# Patient Record
Sex: Male | Born: 1950 | Race: White | Hispanic: No | Marital: Married | Smoking: Never smoker
Health system: Southern US, Community
[De-identification: ages and names within clinical notes are randomized; demographics above are authoritative.]

## PROBLEM LIST (undated history)

## (undated) DIAGNOSIS — L409 Psoriasis, unspecified: Secondary | ICD-10-CM

## (undated) HISTORY — PX: EYE SURGERY: SHX253

## (undated) HISTORY — PX: RETINAL DETACHMENT SURGERY: SHX105

## (undated) HISTORY — DX: Psoriasis, unspecified: L40.9

## (undated) HISTORY — PX: CATARACT EXTRACTION: SUR2

---

## 2004-12-20 ENCOUNTER — Ambulatory Visit: Payer: Self-pay | Admitting: Internal Medicine

## 2004-12-29 ENCOUNTER — Ambulatory Visit: Payer: Self-pay | Admitting: Internal Medicine

## 2007-03-21 ENCOUNTER — Encounter: Admission: RE | Admit: 2007-03-21 | Discharge: 2007-03-21 | Payer: Self-pay | Admitting: Family Medicine

## 2014-12-20 ENCOUNTER — Encounter: Payer: Self-pay | Admitting: Internal Medicine

## 2014-12-22 ENCOUNTER — Encounter: Payer: Self-pay | Admitting: Internal Medicine

## 2015-07-28 ENCOUNTER — Encounter: Payer: Self-pay | Admitting: Internal Medicine

## 2015-08-30 ENCOUNTER — Other Ambulatory Visit (HOSPITAL_COMMUNITY): Payer: Self-pay | Admitting: Orthopedic Surgery

## 2015-08-30 ENCOUNTER — Other Ambulatory Visit: Payer: Self-pay | Admitting: Orthopedic Surgery

## 2015-08-30 ENCOUNTER — Ambulatory Visit (HOSPITAL_COMMUNITY)
Admission: RE | Admit: 2015-08-30 | Discharge: 2015-08-30 | Disposition: A | Discharge status: Home / Self Care | Payer: Commercial Managed Care - HMO | Source: Ambulatory Visit | Attending: Cardiovascular Disease | Admitting: Cardiovascular Disease

## 2015-08-30 DIAGNOSIS — M79621 Pain in right upper arm: Secondary | ICD-10-CM | POA: Insufficient documentation

## 2015-08-30 DIAGNOSIS — R52 Pain, unspecified: Secondary | ICD-10-CM

## 2015-09-01 ENCOUNTER — Ambulatory Visit
Admission: RE | Admit: 2015-09-01 | Discharge: 2015-09-01 | Disposition: A | Discharge status: Home / Self Care | Payer: 59 | Source: Ambulatory Visit | Attending: Orthopedic Surgery | Admitting: Orthopedic Surgery

## 2015-09-01 DIAGNOSIS — R52 Pain, unspecified: Secondary | ICD-10-CM

## 2015-09-06 ENCOUNTER — Other Ambulatory Visit: Payer: Self-pay | Admitting: Nurse Practitioner

## 2015-09-06 DIAGNOSIS — S46211A Strain of muscle, fascia and tendon of other parts of biceps, right arm, initial encounter: Secondary | ICD-10-CM

## 2015-09-06 DIAGNOSIS — S40021A Contusion of right upper arm, initial encounter: Secondary | ICD-10-CM

## 2015-09-11 ENCOUNTER — Other Ambulatory Visit: Payer: Self-pay

## 2016-05-02 DIAGNOSIS — K4090 Unilateral inguinal hernia, without obstruction or gangrene, not specified as recurrent: Secondary | ICD-10-CM | POA: Diagnosis not present

## 2016-05-02 DIAGNOSIS — Z23 Encounter for immunization: Secondary | ICD-10-CM | POA: Diagnosis not present

## 2016-05-02 DIAGNOSIS — Z125 Encounter for screening for malignant neoplasm of prostate: Secondary | ICD-10-CM | POA: Diagnosis not present

## 2016-05-02 DIAGNOSIS — R1310 Dysphagia, unspecified: Secondary | ICD-10-CM | POA: Diagnosis not present

## 2016-05-02 DIAGNOSIS — Z1211 Encounter for screening for malignant neoplasm of colon: Secondary | ICD-10-CM | POA: Diagnosis not present

## 2016-05-02 DIAGNOSIS — Z0000 Encounter for general adult medical examination without abnormal findings: Secondary | ICD-10-CM | POA: Diagnosis not present

## 2016-05-02 DIAGNOSIS — E781 Pure hyperglyceridemia: Secondary | ICD-10-CM | POA: Diagnosis not present

## 2016-05-02 DIAGNOSIS — R000 Tachycardia, unspecified: Secondary | ICD-10-CM | POA: Diagnosis not present

## 2016-07-13 DIAGNOSIS — Z1211 Encounter for screening for malignant neoplasm of colon: Secondary | ICD-10-CM | POA: Diagnosis not present

## 2016-09-21 DIAGNOSIS — Z0100 Encounter for examination of eyes and vision without abnormal findings: Secondary | ICD-10-CM | POA: Diagnosis not present

## 2016-09-21 DIAGNOSIS — H524 Presbyopia: Secondary | ICD-10-CM | POA: Diagnosis not present

## 2016-10-16 DIAGNOSIS — Z961 Presence of intraocular lens: Secondary | ICD-10-CM | POA: Diagnosis not present

## 2016-10-16 DIAGNOSIS — H25012 Cortical age-related cataract, left eye: Secondary | ICD-10-CM | POA: Diagnosis not present

## 2016-10-16 DIAGNOSIS — H2512 Age-related nuclear cataract, left eye: Secondary | ICD-10-CM | POA: Diagnosis not present

## 2016-10-16 DIAGNOSIS — H2511 Age-related nuclear cataract, right eye: Secondary | ICD-10-CM | POA: Diagnosis not present

## 2016-10-16 DIAGNOSIS — H25041 Posterior subcapsular polar age-related cataract, right eye: Secondary | ICD-10-CM | POA: Diagnosis not present

## 2016-10-16 DIAGNOSIS — H25011 Cortical age-related cataract, right eye: Secondary | ICD-10-CM | POA: Diagnosis not present

## 2016-10-16 DIAGNOSIS — H26491 Other secondary cataract, right eye: Secondary | ICD-10-CM | POA: Diagnosis not present

## 2016-11-06 DIAGNOSIS — H2512 Age-related nuclear cataract, left eye: Secondary | ICD-10-CM | POA: Diagnosis not present

## 2016-11-19 IMAGING — MR MR HUMERUS*R* W/O CM
4 of 7 series · 21 of 40 positions shown · non-contrast
Comparison: None.

CLINICAL DATA: Status post fall 1-2 weeks ago with swelling, pain
and bruising developing up about the right upper arm after the fall.
Initial encounter.

EXAM:
MRI OF THE RIGHT HUMERUS WITHOUT CONTRAST
TECHNIQUE: Multiplanar, multisequence MR imaging was performed. No intravenous
contrast was administered.

[Series 5: T2 fat-sat · axial · 6.0mm · 0.35mm/px · z∈[-73,+149]mm · 7 of 30 slices shown (1 of 2)]
[im 1/30]
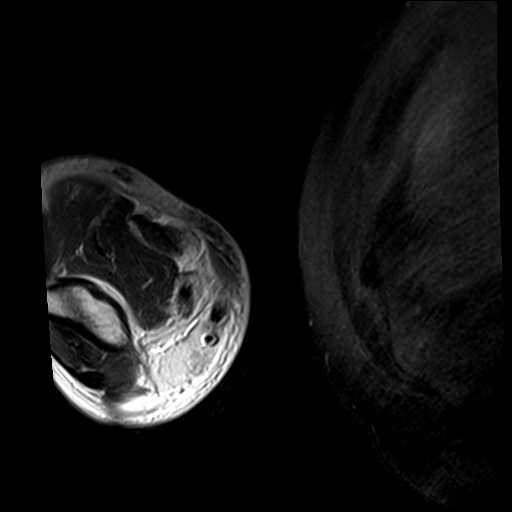
[im 5/30]
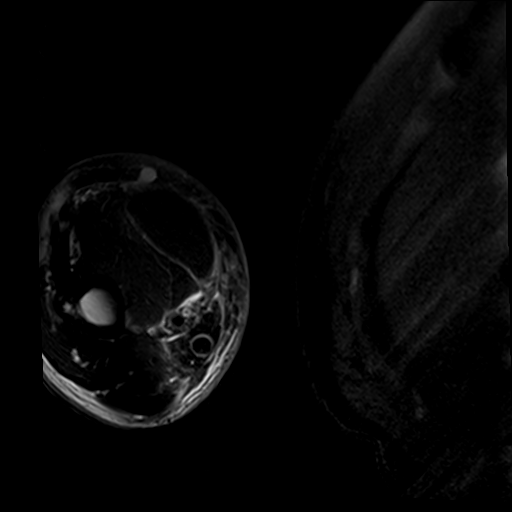
[im 10/30]
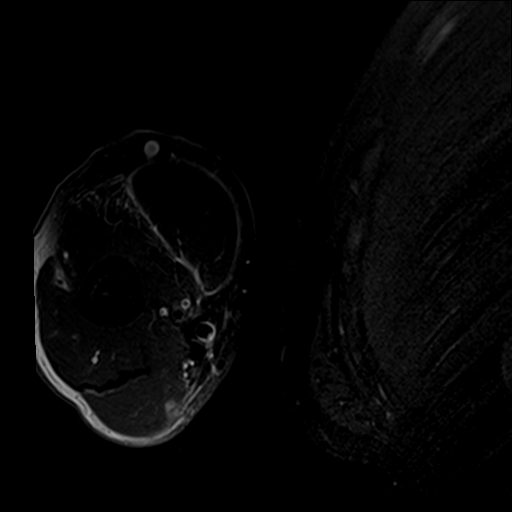
[im 15/30]
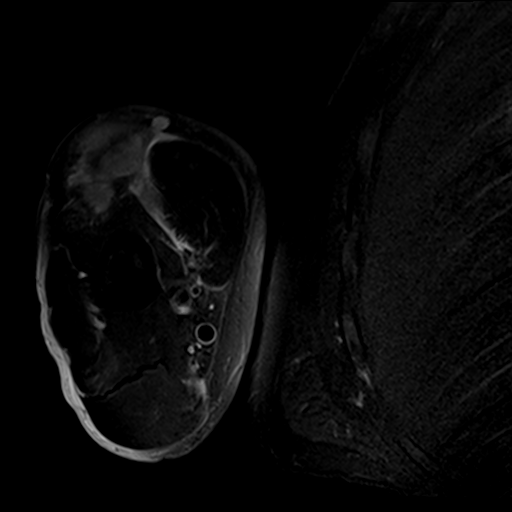
[im 20/30]
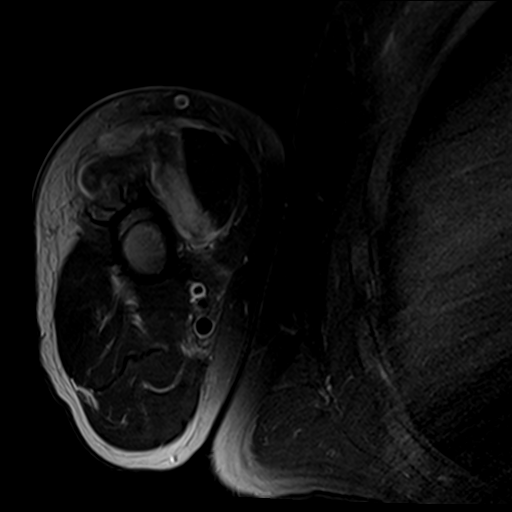
[im 25/30]
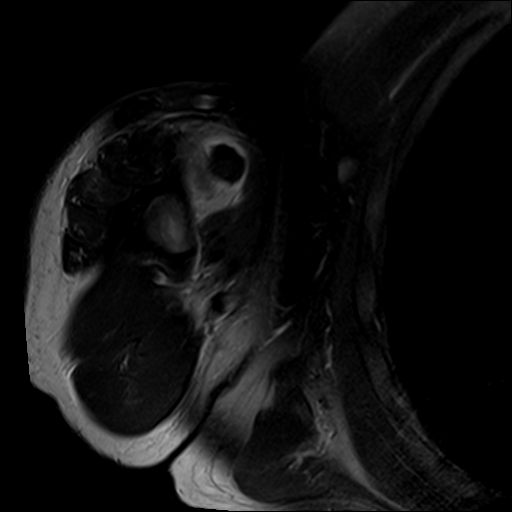
[im 30/30]
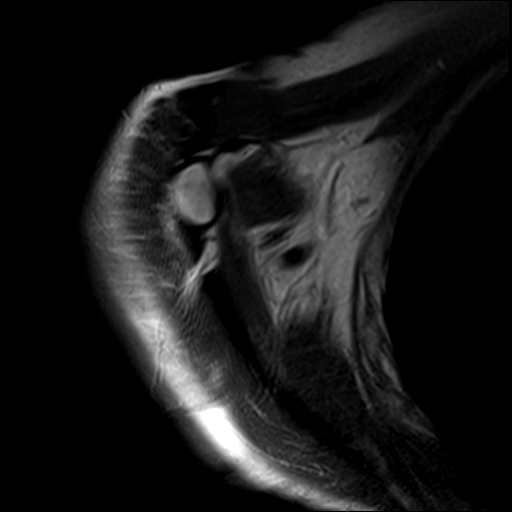

[Series 6: T1 · axial · 6.0mm · 0.35mm/px · z∈[-73,+111]mm · 6 of 30 slices shown (1 of 2)]
[im 1/30]
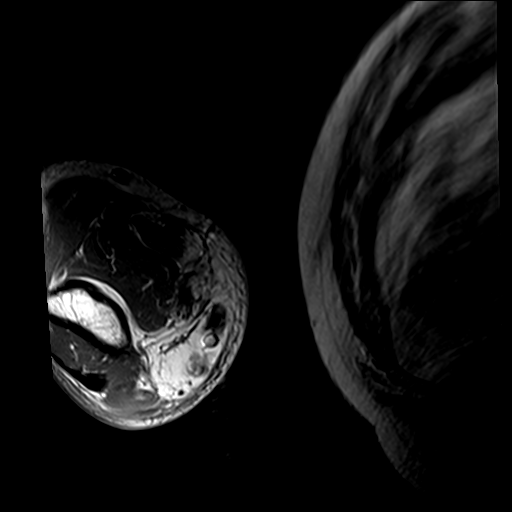
[im 5/30]
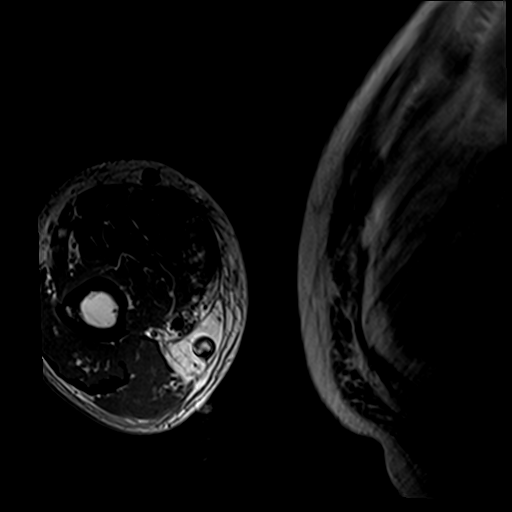
[im 9/30]
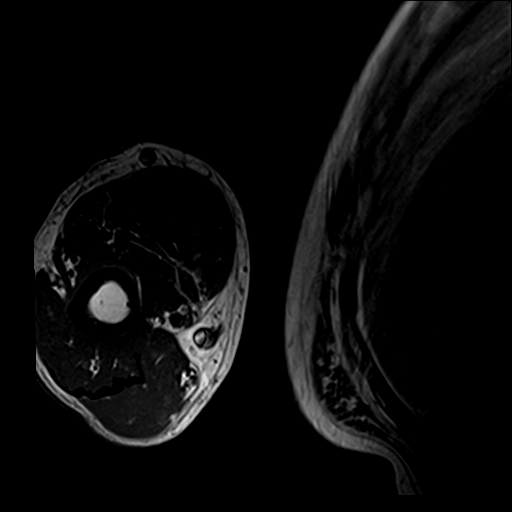
[im 13/30]
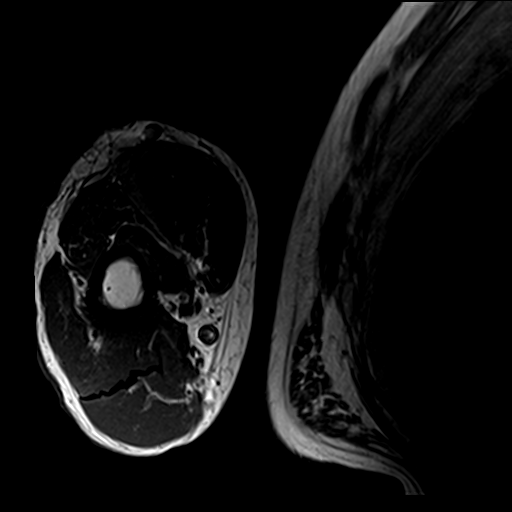
[im 17/30]
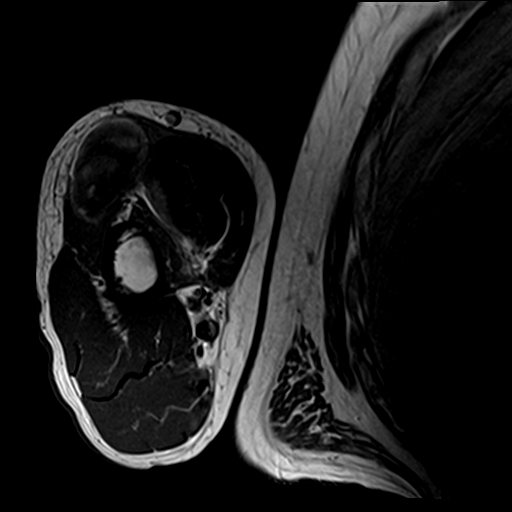
[im 25/30]
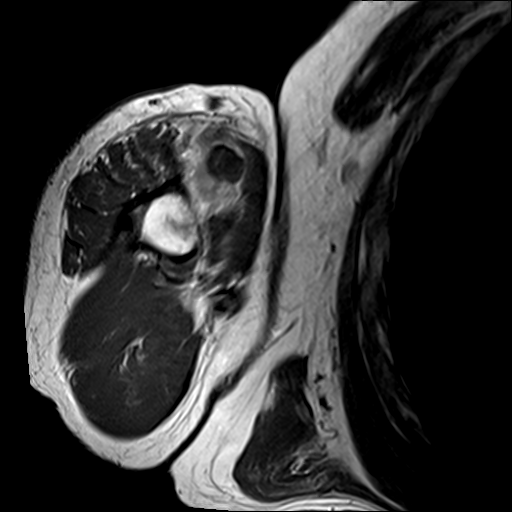

[Series 7: T1 · coronal · 5.5mm · 0.39mm/px · 3 of 18 slices shown (2 of 2)]
[im 1/18]
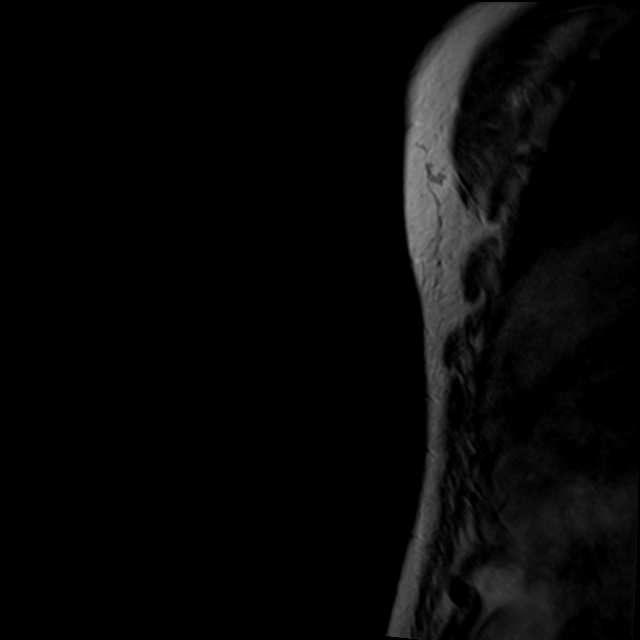
[im 9/18]
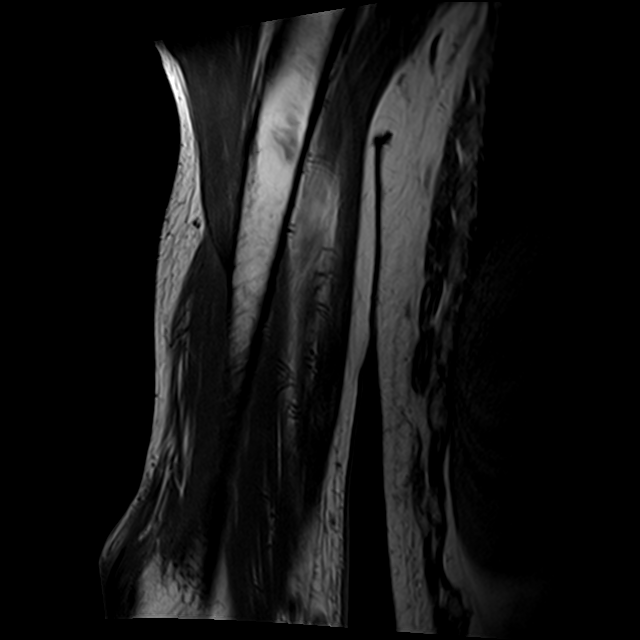
[im 18/18]
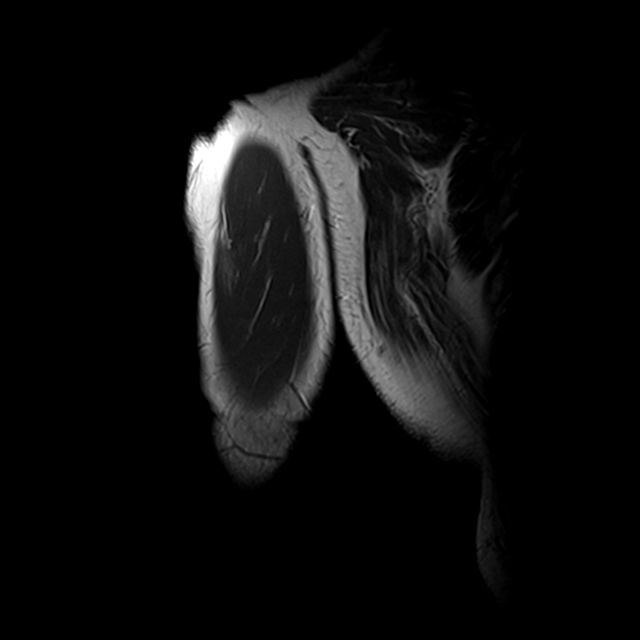

[Series 9: T2 fat-sat · coronal · 5.5mm · 0.86mm/px · 5 of 18 slices shown (2 of 2)]
[im 1/18]
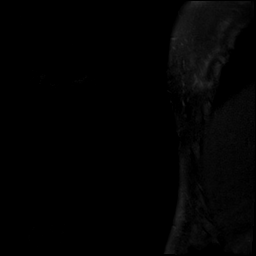
[im 5/18]
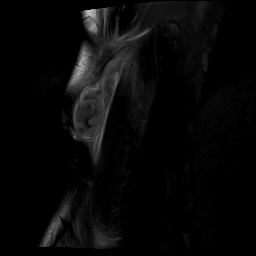
[im 9/18]
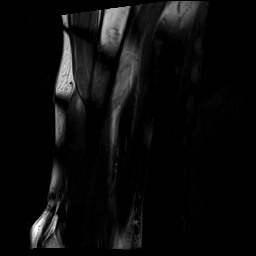
[im 13/18]
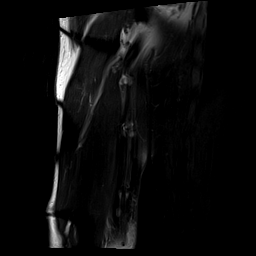
[im 18/18]
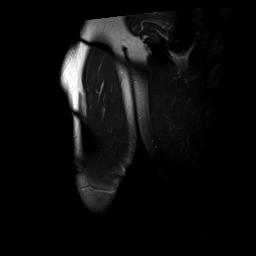

[21 of 40 positions shown; findings below may reference images not displayed]

FINDINGS: Markers are placed above and below the region of concern. Between
the markers, there is a mixed signal intensity collection measuring
approximately 9 cm craniocaudal by up to 3.7 cm AP x 2.3 cm
transverse. The collection is centered in the anterior and lateral
arm lateral to the biceps muscle belly and is most consistent with a
hematoma. No muscle or tendon tear is identified. No fracture is
seen. Bone marrow signal is unremarkable. No other abnormality is
identified.
IMPRESSION: Given history of fall with acute onset of swelling, collection along
the anterior and far lateral aspect of the right upper arm is most
consistent with a hematoma. No muscle tear or fracture is
identified.

## 2016-11-26 DIAGNOSIS — H25012 Cortical age-related cataract, left eye: Secondary | ICD-10-CM | POA: Diagnosis not present

## 2016-11-26 DIAGNOSIS — H25042 Posterior subcapsular polar age-related cataract, left eye: Secondary | ICD-10-CM | POA: Diagnosis not present

## 2016-11-26 DIAGNOSIS — H2512 Age-related nuclear cataract, left eye: Secondary | ICD-10-CM | POA: Diagnosis not present

## 2017-04-25 DIAGNOSIS — L570 Actinic keratosis: Secondary | ICD-10-CM | POA: Diagnosis not present

## 2017-04-25 DIAGNOSIS — L820 Inflamed seborrheic keratosis: Secondary | ICD-10-CM | POA: Diagnosis not present

## 2017-07-08 DIAGNOSIS — M5432 Sciatica, left side: Secondary | ICD-10-CM | POA: Diagnosis not present

## 2017-07-08 DIAGNOSIS — M545 Low back pain: Secondary | ICD-10-CM | POA: Diagnosis not present

## 2017-07-10 DIAGNOSIS — M545 Low back pain: Secondary | ICD-10-CM | POA: Diagnosis not present

## 2017-07-10 DIAGNOSIS — M5432 Sciatica, left side: Secondary | ICD-10-CM | POA: Diagnosis not present

## 2017-08-08 DIAGNOSIS — M25569 Pain in unspecified knee: Secondary | ICD-10-CM | POA: Diagnosis not present

## 2019-01-01 DIAGNOSIS — H5203 Hypermetropia, bilateral: Secondary | ICD-10-CM | POA: Diagnosis not present

## 2019-01-01 DIAGNOSIS — H52223 Regular astigmatism, bilateral: Secondary | ICD-10-CM | POA: Diagnosis not present

## 2019-01-01 DIAGNOSIS — H35022 Exudative retinopathy, left eye: Secondary | ICD-10-CM | POA: Diagnosis not present

## 2019-01-01 DIAGNOSIS — H353221 Exudative age-related macular degeneration, left eye, with active choroidal neovascularization: Secondary | ICD-10-CM | POA: Diagnosis not present

## 2019-01-01 DIAGNOSIS — H3562 Retinal hemorrhage, left eye: Secondary | ICD-10-CM | POA: Diagnosis not present

## 2019-01-01 DIAGNOSIS — H34832 Tributary (branch) retinal vein occlusion, left eye, with macular edema: Secondary | ICD-10-CM | POA: Diagnosis not present

## 2019-01-01 DIAGNOSIS — H524 Presbyopia: Secondary | ICD-10-CM | POA: Diagnosis not present

## 2019-01-01 DIAGNOSIS — H35722 Serous detachment of retinal pigment epithelium, left eye: Secondary | ICD-10-CM | POA: Diagnosis not present

## 2019-01-01 DIAGNOSIS — Z961 Presence of intraocular lens: Secondary | ICD-10-CM | POA: Diagnosis not present

## 2019-01-07 DIAGNOSIS — H35722 Serous detachment of retinal pigment epithelium, left eye: Secondary | ICD-10-CM | POA: Diagnosis not present

## 2019-01-07 DIAGNOSIS — H35022 Exudative retinopathy, left eye: Secondary | ICD-10-CM | POA: Diagnosis not present

## 2019-01-07 DIAGNOSIS — H33052 Total retinal detachment, left eye: Secondary | ICD-10-CM | POA: Diagnosis not present

## 2019-01-08 DIAGNOSIS — H35022 Exudative retinopathy, left eye: Secondary | ICD-10-CM | POA: Diagnosis not present

## 2019-01-08 DIAGNOSIS — H35722 Serous detachment of retinal pigment epithelium, left eye: Secondary | ICD-10-CM | POA: Diagnosis not present

## 2019-01-08 DIAGNOSIS — H353221 Exudative age-related macular degeneration, left eye, with active choroidal neovascularization: Secondary | ICD-10-CM | POA: Diagnosis not present

## 2019-01-08 DIAGNOSIS — H4312 Vitreous hemorrhage, left eye: Secondary | ICD-10-CM | POA: Diagnosis not present

## 2019-01-13 DIAGNOSIS — H4312 Vitreous hemorrhage, left eye: Secondary | ICD-10-CM | POA: Diagnosis not present

## 2019-01-13 DIAGNOSIS — H353221 Exudative age-related macular degeneration, left eye, with active choroidal neovascularization: Secondary | ICD-10-CM | POA: Diagnosis not present

## 2019-01-13 DIAGNOSIS — H35022 Exudative retinopathy, left eye: Secondary | ICD-10-CM | POA: Diagnosis not present

## 2019-01-13 DIAGNOSIS — H35722 Serous detachment of retinal pigment epithelium, left eye: Secondary | ICD-10-CM | POA: Diagnosis not present

## 2019-01-14 DIAGNOSIS — H3562 Retinal hemorrhage, left eye: Secondary | ICD-10-CM | POA: Diagnosis not present

## 2019-01-14 DIAGNOSIS — H4312 Vitreous hemorrhage, left eye: Secondary | ICD-10-CM | POA: Diagnosis not present

## 2019-01-14 DIAGNOSIS — H35022 Exudative retinopathy, left eye: Secondary | ICD-10-CM | POA: Diagnosis not present

## 2019-01-14 DIAGNOSIS — H33052 Total retinal detachment, left eye: Secondary | ICD-10-CM | POA: Diagnosis not present

## 2019-01-14 DIAGNOSIS — H33032 Retinal detachment with giant retinal tear, left eye: Secondary | ICD-10-CM | POA: Diagnosis not present

## 2019-02-05 DIAGNOSIS — T380X5A Adverse effect of glucocorticoids and synthetic analogues, initial encounter: Secondary | ICD-10-CM | POA: Diagnosis not present

## 2019-02-12 DIAGNOSIS — H33052 Total retinal detachment, left eye: Secondary | ICD-10-CM | POA: Diagnosis not present

## 2019-02-12 DIAGNOSIS — Z09 Encounter for follow-up examination after completed treatment for conditions other than malignant neoplasm: Secondary | ICD-10-CM | POA: Diagnosis not present

## 2019-04-09 DIAGNOSIS — T380X5A Adverse effect of glucocorticoids and synthetic analogues, initial encounter: Secondary | ICD-10-CM | POA: Diagnosis not present

## 2019-04-09 DIAGNOSIS — H3342 Traction detachment of retina, left eye: Secondary | ICD-10-CM | POA: Diagnosis not present

## 2019-05-06 DIAGNOSIS — H3342 Traction detachment of retina, left eye: Secondary | ICD-10-CM | POA: Diagnosis not present

## 2019-05-06 DIAGNOSIS — H33022 Retinal detachment with multiple breaks, left eye: Secondary | ICD-10-CM | POA: Diagnosis not present

## 2019-05-06 DIAGNOSIS — Z8669 Personal history of other diseases of the nervous system and sense organs: Secondary | ICD-10-CM | POA: Diagnosis not present

## 2019-05-06 DIAGNOSIS — H33052 Total retinal detachment, left eye: Secondary | ICD-10-CM | POA: Diagnosis not present

## 2019-05-07 DIAGNOSIS — H3342 Traction detachment of retina, left eye: Secondary | ICD-10-CM | POA: Diagnosis not present

## 2019-05-07 DIAGNOSIS — H35022 Exudative retinopathy, left eye: Secondary | ICD-10-CM | POA: Diagnosis not present

## 2019-05-07 DIAGNOSIS — H33052 Total retinal detachment, left eye: Secondary | ICD-10-CM | POA: Diagnosis not present

## 2019-05-07 DIAGNOSIS — H353221 Exudative age-related macular degeneration, left eye, with active choroidal neovascularization: Secondary | ICD-10-CM | POA: Diagnosis not present

## 2019-05-14 DIAGNOSIS — H35352 Cystoid macular degeneration, left eye: Secondary | ICD-10-CM | POA: Diagnosis not present

## 2019-05-14 DIAGNOSIS — Z09 Encounter for follow-up examination after completed treatment for conditions other than malignant neoplasm: Secondary | ICD-10-CM | POA: Diagnosis not present

## 2019-06-16 DIAGNOSIS — Z09 Encounter for follow-up examination after completed treatment for conditions other than malignant neoplasm: Secondary | ICD-10-CM | POA: Diagnosis not present

## 2019-06-16 DIAGNOSIS — H35352 Cystoid macular degeneration, left eye: Secondary | ICD-10-CM | POA: Diagnosis not present

## 2019-08-11 DIAGNOSIS — H33052 Total retinal detachment, left eye: Secondary | ICD-10-CM | POA: Diagnosis not present

## 2019-08-11 DIAGNOSIS — H35352 Cystoid macular degeneration, left eye: Secondary | ICD-10-CM | POA: Diagnosis not present

## 2019-08-11 DIAGNOSIS — H3342 Traction detachment of retina, left eye: Secondary | ICD-10-CM | POA: Diagnosis not present

## 2019-08-25 DIAGNOSIS — M545 Low back pain: Secondary | ICD-10-CM | POA: Diagnosis not present

## 2019-08-25 DIAGNOSIS — M5432 Sciatica, left side: Secondary | ICD-10-CM | POA: Diagnosis not present

## 2019-08-27 DIAGNOSIS — M545 Low back pain: Secondary | ICD-10-CM | POA: Diagnosis not present

## 2019-08-27 DIAGNOSIS — M5432 Sciatica, left side: Secondary | ICD-10-CM | POA: Diagnosis not present

## 2019-09-01 DIAGNOSIS — M5432 Sciatica, left side: Secondary | ICD-10-CM | POA: Diagnosis not present

## 2019-09-01 DIAGNOSIS — M545 Low back pain: Secondary | ICD-10-CM | POA: Diagnosis not present

## 2019-09-03 DIAGNOSIS — M5432 Sciatica, left side: Secondary | ICD-10-CM | POA: Diagnosis not present

## 2019-09-03 DIAGNOSIS — M545 Low back pain: Secondary | ICD-10-CM | POA: Diagnosis not present

## 2019-09-08 DIAGNOSIS — M5432 Sciatica, left side: Secondary | ICD-10-CM | POA: Diagnosis not present

## 2019-09-08 DIAGNOSIS — M545 Low back pain: Secondary | ICD-10-CM | POA: Diagnosis not present

## 2019-09-10 DIAGNOSIS — M545 Low back pain: Secondary | ICD-10-CM | POA: Diagnosis not present

## 2019-09-10 DIAGNOSIS — M5432 Sciatica, left side: Secondary | ICD-10-CM | POA: Diagnosis not present

## 2019-09-14 DIAGNOSIS — M5432 Sciatica, left side: Secondary | ICD-10-CM | POA: Diagnosis not present

## 2019-09-14 DIAGNOSIS — M545 Low back pain: Secondary | ICD-10-CM | POA: Diagnosis not present

## 2019-09-15 DIAGNOSIS — H3342 Traction detachment of retina, left eye: Secondary | ICD-10-CM | POA: Diagnosis not present

## 2019-09-15 DIAGNOSIS — Z09 Encounter for follow-up examination after completed treatment for conditions other than malignant neoplasm: Secondary | ICD-10-CM | POA: Diagnosis not present

## 2019-09-16 DIAGNOSIS — M545 Low back pain: Secondary | ICD-10-CM | POA: Diagnosis not present

## 2019-09-16 DIAGNOSIS — M5432 Sciatica, left side: Secondary | ICD-10-CM | POA: Diagnosis not present

## 2019-09-17 DIAGNOSIS — M47819 Spondylosis without myelopathy or radiculopathy, site unspecified: Secondary | ICD-10-CM | POA: Diagnosis not present

## 2019-09-17 DIAGNOSIS — M5432 Sciatica, left side: Secondary | ICD-10-CM | POA: Diagnosis not present

## 2019-09-17 DIAGNOSIS — M545 Low back pain: Secondary | ICD-10-CM | POA: Diagnosis not present

## 2019-09-17 DIAGNOSIS — M19011 Primary osteoarthritis, right shoulder: Secondary | ICD-10-CM | POA: Diagnosis not present

## 2019-09-17 DIAGNOSIS — M25811 Other specified joint disorders, right shoulder: Secondary | ICD-10-CM | POA: Diagnosis not present

## 2019-09-21 DIAGNOSIS — M5432 Sciatica, left side: Secondary | ICD-10-CM | POA: Diagnosis not present

## 2019-09-21 DIAGNOSIS — M545 Low back pain: Secondary | ICD-10-CM | POA: Diagnosis not present

## 2019-09-23 DIAGNOSIS — M545 Low back pain: Secondary | ICD-10-CM | POA: Diagnosis not present

## 2019-09-23 DIAGNOSIS — M5432 Sciatica, left side: Secondary | ICD-10-CM | POA: Diagnosis not present

## 2019-09-25 DIAGNOSIS — M545 Low back pain: Secondary | ICD-10-CM | POA: Diagnosis not present

## 2019-09-25 DIAGNOSIS — M5432 Sciatica, left side: Secondary | ICD-10-CM | POA: Diagnosis not present

## 2019-10-07 DIAGNOSIS — M545 Low back pain: Secondary | ICD-10-CM | POA: Diagnosis not present

## 2019-10-07 DIAGNOSIS — M5432 Sciatica, left side: Secondary | ICD-10-CM | POA: Diagnosis not present

## 2019-11-22 ENCOUNTER — Other Ambulatory Visit: Payer: Self-pay

## 2019-11-22 ENCOUNTER — Encounter: Payer: Self-pay | Admitting: Emergency Medicine

## 2019-11-22 ENCOUNTER — Ambulatory Visit
Admission: EM | Admit: 2019-11-22 | Discharge: 2019-11-22 | Disposition: A | Discharge status: Home / Self Care | Payer: Medicare HMO | Attending: Physician Assistant | Admitting: Physician Assistant

## 2019-11-22 DIAGNOSIS — L2489 Irritant contact dermatitis due to other agents: Secondary | ICD-10-CM | POA: Diagnosis not present

## 2019-11-22 MED ORDER — DEXAMETHASONE SODIUM PHOSPHATE 10 MG/ML IJ SOLN
10.0000 mg | Freq: Once | INTRAMUSCULAR | Status: AC
  Administered 2019-11-22: 10 mg via INTRAMUSCULAR

## 2019-11-22 NOTE — ED Triage Notes (Signed)
Wonders if it is stress related, wife had two strokes 3 weeks ago

## 2019-11-22 NOTE — ED Triage Notes (Signed)
Rash started 3 weeks ago over lower legs. Rash has since spread up legs and onto arms and back. Describes it as "bubbly" initially. Has since dried out. Areas are itchy.

## 2019-11-22 NOTE — Discharge Instructions (Signed)
As discussed, rash most consistent with irritation from contact. At this time, avoid soap to the skin and apply lotion. Decadron injection in office today. If rash not improving after 3 days, please call back and we may add on steroid pills. Monitor for any infection like symptoms such as redness, warmth, swelling, pain, follow up for reevaluation needed.

## 2019-11-22 NOTE — ED Provider Notes (Signed)
[facility]    CSN: [id number] Arrival date & time: 11/22/19  0802      History   Chief Complaint Chief Complaint  Patient presents with  . Rash    HPI Glenn Blevins is a 68 y.o. male.   68 year old male comes in for 3-week history of rash.  This for started after yard work to bilateral lower legs.  Patient describes it as vesicular in nature with itching and oozing, and thought it to be poison ivy rash.  Unfortunately, around the same time, patient's spouse had a stroke, and he was unable to come in for evaluation.  Since then, rash has spread to the back, upper extremity with itching sensation.  Denies spreading erythema, warmth, fever.  Denies URI symptoms such as cough, congestion, sore throat.  Denies burning sensation, numbness, tingling.     History reviewed. No pertinent past medical history.  There are no problems to display for this patient.   Past Surgical History:  Procedure Laterality Date  . EYE SURGERY         Home Medications    Prior to Admission medications   Not on File    Family History History reviewed. No pertinent family history.  Social History Social History   Tobacco Use  . Smoking status: Never Smoker  . Smokeless tobacco: Never Used  Substance Use Topics  . Alcohol use: Not on file  . Drug use: Not on file     Allergies   Patient has no known allergies.   Review of Systems Review of Systems  Reason unable to perform ROS: See HPI as above.     Physical Exam Triage Vital Signs ED Triage Vitals  Enc Vitals Group     BP 11/22/19 0819 132/82     Pulse Rate 11/22/19 0819 (!) 122     Resp 11/22/19 0819 16     Temp 11/22/19 0819 98.9 F (37.2 C)     Temp Source 11/22/19 0819 Oral     SpO2 11/22/19 0819 97 %     Weight --      Height --      Head Circumference --      Peak Flow --      Pain Score 11/22/19 0817 0     Pain Loc --      Pain Edu? --      Excl. in [place]? --    No data found.  Updated  Vital Signs BP 132/82   Pulse (!) 122   Temp 98.9 F (37.2 C) (Oral)   Resp 16   SpO2 97%   Physical Exam Constitutional:      General: He is not in acute distress.    Appearance: Normal appearance. He is not ill-appearing, toxic-appearing or diaphoretic.  HENT:     Head: Normocephalic and atraumatic.     Mouth/Throat:     Mouth: Mucous membranes are moist.     Pharynx: Oropharynx is clear. Uvula midline.  Cardiovascular:     Rate and Rhythm: Normal rate and regular rhythm.     Pulses:          Radial pulses are 2+ on the right side and 2+ on the left side.     Heart sounds: Normal heart sounds. No murmur. No friction rub. No gallop.   Pulmonary:     Effort: Pulmonary effort is normal. No accessory muscle usage, prolonged expiration, respiratory distress or retractions.     Comments: Lungs clear to auscultation  without adventitious lung sounds. Musculoskeletal:     Cervical back: Normal range of motion and neck supple.  Skin:    Comments: See picture below.  No active oozing noted.  Skin surrounding rash dry and cracking.  No warmth or pain on palpation.  Neurological:     General: No focal deficit present.     Mental Status: He is alert and oriented to person, place, and time.            UC Treatments / Results  Labs (all labs ordered are listed, but only abnormal results are displayed) Labs Reviewed - No data to display  EKG   Radiology No results found.  Procedures Procedures (including critical care time)  Medications Ordered in UC Medications  dexamethasone (DECADRON) injection 10 mg (10 mg Intramuscular Given 11/22/19 0850)    Initial Impression / Assessment and Plan / UC Course  I have reviewed the triage vital signs and the nursing notes.  Pertinent labs & imaging results that were available during my care of the patient were reviewed by me and considered in my medical decision making (see chart for details).    Will treat for contact  dermatitis.  Patient with good response to Decadron injection in the past, will provide Decadron injection in office today.  If symptoms not improving in 2 to 3 days, can call back for oral steroid course.  Given dry skin, to decrease use of soap at this time and keep skin moisturized.  Return precautions given.  Patient was found tachycardic in office.  Denies chest pain, shortness of breath, weakness, palpitation, dizziness, syncope.  States he has history of tachycardia during office visits due to nervousness.  States usually returns to normal at home.  His pulse 2+, regular.  Will have patient monitor at this time.  Return precautions given.  Patient expresses understanding and agrees to plan.  Final Clinical Impressions(s) / UC Diagnoses   Final diagnoses:  Irritant contact dermatitis due to other agents   ED Prescriptions    None     PDMP not reviewed this encounter.   Ok Edwards, PA-C 11/22/19 1053

## 2019-11-25 ENCOUNTER — Telehealth: Payer: Self-pay | Admitting: Physician Assistant

## 2019-11-25 MED ORDER — PREDNISONE 50 MG PO TABS: 50.0000 mg | ORAL_TABLET | Freq: Every day | ORAL | 0 refills | Status: DC

## 2019-11-25 NOTE — Telephone Encounter (Signed)
Patient called back after visit 11/22/2019.  At that time, we did a Decadron injection, and deferred prednisone.  Patient was to call back if symptoms not improving for prednisone course.  States since Decadron, has had improvement of symptoms, however still itching, and rash is still diffuse.  He denies any pain, fever, swelling.  Will call in prednisone 50 mg x 5 days at this time.  Continue to monitor.  Follow-up with PCP in 1 week if symptoms not improving.  Return precautions given.  Patient expresses understanding and agrees to plan.

## 2019-12-07 ENCOUNTER — Telehealth: Payer: Self-pay | Admitting: Emergency Medicine

## 2019-12-07 NOTE — Telephone Encounter (Signed)
Patient called wanting to know if we could send in an additional prescription for more steroids.  This RN and APP on site reviewed patient's chart, and encouraged patient to follow up with PCP or dermatology since it has been 15 days and it has not resolved.

## 2019-12-09 ENCOUNTER — Telehealth: Payer: Self-pay | Admitting: Physician Assistant

## 2019-12-09 MED ORDER — SARNA 0.5-0.5 % EX LOTN: 1.0000 "application " | TOPICAL_LOTION | CUTANEOUS | 0 refills | Status: DC | PRN

## 2019-12-09 NOTE — Telephone Encounter (Signed)
Patient calls back for continued rash since visit 11/22/2019. He was first given decadron injection with some improvement but no resolution. Therefore, prednisone course 50mg  QD x 5 days was started. Patient states with significant improvement, but rash returned after finishing prednisone course. Main complain is itching sensation. Denies fever, chills, spreading erythema, warmth, pain. Has dermatology appointment in 1 week for further evaluation. Will provide patient with SARNA lotion at for now to help with symptomatic management until evaluation by dermatology. Return precautions given. Patient expresses understanding and agrees to plan.

## 2019-12-15 DIAGNOSIS — H3342 Traction detachment of retina, left eye: Secondary | ICD-10-CM | POA: Diagnosis not present

## 2019-12-15 DIAGNOSIS — H35352 Cystoid macular degeneration, left eye: Secondary | ICD-10-CM | POA: Diagnosis not present

## 2019-12-15 DIAGNOSIS — T380X5A Adverse effect of glucocorticoids and synthetic analogues, initial encounter: Secondary | ICD-10-CM | POA: Diagnosis not present

## 2019-12-15 DIAGNOSIS — H33052 Total retinal detachment, left eye: Secondary | ICD-10-CM | POA: Diagnosis not present

## 2019-12-21 DIAGNOSIS — L259 Unspecified contact dermatitis, unspecified cause: Secondary | ICD-10-CM | POA: Diagnosis not present

## 2019-12-21 DIAGNOSIS — L309 Dermatitis, unspecified: Secondary | ICD-10-CM | POA: Diagnosis not present

## 2019-12-21 DIAGNOSIS — D485 Neoplasm of uncertain behavior of skin: Secondary | ICD-10-CM | POA: Diagnosis not present

## 2020-01-07 ENCOUNTER — Ambulatory Visit: Payer: Medicare HMO

## 2020-01-12 ENCOUNTER — Encounter: Payer: Self-pay | Admitting: Internal Medicine

## 2020-01-12 ENCOUNTER — Telehealth: Payer: Self-pay | Admitting: Internal Medicine

## 2020-01-12 NOTE — Patient Instructions (Signed)
Thank you for choosing Primary Care at [facility] to be your medical home!    Tristan Schroeder was seen by Melina Schools, DO today.   Bethann Berkshire primary care provider is Phill Myron, DO.   For the best care possible, you should try to see Phill Myron, DO whenever you come to the clinic.   We look forward to seeing you again soon!  If you have any questions about your visit today, please call us at [phone] or feel free to reach your primary care provider via [place].

## 2020-01-12 NOTE — Telephone Encounter (Signed)
Called patient to do their pre-visit COVID screening.     Have you tested positive for COVID or are you currently waiting for COVID test results?  Patient said no    Have you recently traveled internationally([place], [place], [place], [place], [place]) or within the [place] to a hotspot area([place], [place], [place], [place], [place])?  Patient said no     Are you currently experiencing any of the following symptoms: fever, cough, SHOB, fatigue, body aches, loss of smell/taste, rash, diarrhea, vomiting, severe headaches, weakness, sore throat?  Patient said no     Have you been in contact with anyone who has recently travelled?  Patient said no    Have you been in contact with anyone who is experiencing any of the above symptoms or been diagnosed with [place]  or works in or has recently visited a SNF?  Patient said no

## 2020-01-13 ENCOUNTER — Other Ambulatory Visit: Payer: Self-pay

## 2020-01-13 ENCOUNTER — Encounter: Payer: Self-pay | Admitting: Internal Medicine

## 2020-01-13 ENCOUNTER — Ambulatory Visit (INDEPENDENT_AMBULATORY_CARE_PROVIDER_SITE_OTHER): Payer: Medicare HMO | Admitting: Internal Medicine

## 2020-01-13 VITALS — BP 164/99 | HR 111 | Temp 97.3°F | Resp 17 | Ht 63.0 in | Wt 156.6 lb

## 2020-01-13 DIAGNOSIS — K4090 Unilateral inguinal hernia, without obstruction or gangrene, not specified as recurrent: Secondary | ICD-10-CM

## 2020-01-13 DIAGNOSIS — Z13228 Encounter for screening for other metabolic disorders: Secondary | ICD-10-CM

## 2020-01-13 DIAGNOSIS — Z1211 Encounter for screening for malignant neoplasm of colon: Secondary | ICD-10-CM | POA: Diagnosis not present

## 2020-01-13 DIAGNOSIS — Z7689 Persons encountering health services in other specified circumstances: Secondary | ICD-10-CM | POA: Diagnosis not present

## 2020-01-13 DIAGNOSIS — R030 Elevated blood-pressure reading, without diagnosis of hypertension: Secondary | ICD-10-CM | POA: Diagnosis not present

## 2020-01-13 DIAGNOSIS — Z1159 Encounter for screening for other viral diseases: Secondary | ICD-10-CM | POA: Diagnosis not present

## 2020-01-13 NOTE — Progress Notes (Addendum)
  Subjective:    Glenn Blevins - 69 y.o. male MRN [id number]  Date of birth: 1951-10-06  HPI  Glenn Blevins is to establish care. 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 No other PMH. Takes no medications.   Elevated BP: Has a BP machine at home because his wife had two strokes in Nov. He monitors her and checks himself. His numbers typically range in 120-130/70-80 range. Says it has never been this high. Drank coffee this morning.   Left Inguinal Hernia: Present at least 5 years. Increasing in size. Not painful. Ready to have surgery, would like to have it done this summer. Requests to have it done this summer.   Depression screen PHQ 2/9 01/13/2020  Decreased Interest 0  Down, Depressed, Hopeless 0  PHQ - 2 Score 0    ROS per HPI    Health Maintenance Due  Topic Date Due  . Hepatitis C Screening  12/07/1951  . COLONOSCOPY  12/29/2014     Past Medical History: There are no problems to display for this patient.     Social History   reports that he has never smoked. He has never used smokeless tobacco. He reports current alcohol use.   Family History  Family history is unknown by patient.   Medications: reviewed and updated   Objective:   Physical Exam BP (!) 164/99   Pulse (!) 111   Temp (!) 97.3 F (36.3 C) (Temporal)   Resp 17   Ht 5\' 3"  (1.6 m)   Wt 156 lb 9.6 oz (71 kg)   SpO2 95%   BMI 27.74 kg/m  Physical Exam  Constitutional: He is oriented to person, place, and time and well-developed, well-nourished, and in no distress. No distress.  HENT:  Head: Normocephalic and atraumatic.  Eyes: Conjunctivae and EOM are normal.  Cardiovascular: Normal rate, regular rhythm and normal heart sounds.  No murmur heard. Pulmonary/Chest: Effort normal and breath sounds normal. No respiratory distress.  Musculoskeletal:        General: Normal range of motion.  Neurological: He is alert and oriented to person, place, and  time.  Skin: Skin is warm and dry. He is not diaphoretic.  Psychiatric: Affect and judgment normal.        Assessment & Plan:   1. Encounter to establish care   2. Need for hepatitis C screening test - Hepatitis C antibody  3. Screening for colon cancer Patient reports had a colonoscopy done within past 10 years. Done 07/13/16 with [facility]. Will obtain records.   4. Elevated BP without diagnosis of hypertension Home BP readings well within range. Continue to monitor at home and at office.   5. Screening for metabolic disorder - Lipid panel - Comprehensive metabolic panel  6. Left inguinal hernia - Ambulatory referral to [place], D.O. 01/13/2020, 9:56 AM Primary Care at [facility]

## 2020-01-14 ENCOUNTER — Other Ambulatory Visit: Payer: Self-pay | Admitting: Internal Medicine

## 2020-01-14 DIAGNOSIS — Z9189 Other specified personal risk factors, not elsewhere classified: Secondary | ICD-10-CM

## 2020-01-14 DIAGNOSIS — E871 Hypo-osmolality and hyponatremia: Secondary | ICD-10-CM

## 2020-01-14 LAB — COMPREHENSIVE METABOLIC PANEL
ALT: 18 IU/L (ref 0–44)
AST: 23 IU/L (ref 0–40)
Albumin/Globulin Ratio: 1.2 (ref 1.2–2.2)
Albumin: 4 g/dL (ref 3.8–4.8)
Alkaline Phosphatase: 78 IU/L (ref 39–117)
BUN/Creatinine Ratio: 15 (ref 10–24)
BUN: 9 mg/dL (ref 8–27)
Bilirubin Total: 0.5 mg/dL (ref 0.0–1.2)
CO2: 25 mmol/L (ref 20–29)
Calcium: 9.6 mg/dL (ref 8.6–10.2)
Chloride: 90 mmol/L — ABNORMAL LOW (ref 96–106)
Creatinine, Ser: 0.62 mg/dL — ABNORMAL LOW (ref 0.76–1.27)
GFR calc Af Amer: 116 mL/min/{1.73_m2} (ref >59)
GFR calc non Af Amer: 101 mL/min/{1.73_m2} (ref >59)
Globulin, Total: 3.4 g/dL (ref 1.5–4.5)
Glucose: 120 mg/dL — ABNORMAL HIGH (ref 65–99)
Potassium: 4.6 mmol/L (ref 3.5–5.2)
Sodium: 128 mmol/L — ABNORMAL LOW (ref 134–144)
Total Protein: 7.4 g/dL (ref 6.0–8.5)

## 2020-01-14 LAB — LIPID PANEL
Chol/HDL Ratio: 1.6 ratio (ref 0.0–5.0)
Cholesterol, Total: 166 mg/dL (ref 100–199)
HDL: 107 mg/dL (ref >39)
LDL Chol Calc (NIH): 49 mg/dL (ref 0–99)
Triglycerides: 44 mg/dL (ref 0–149)
VLDL Cholesterol Cal: 10 mg/dL (ref 5–40)

## 2020-01-14 LAB — HEPATITIS C ANTIBODY: Hep C Virus Ab: 0.1 s/co ratio (ref 0.0–0.9)

## 2020-01-14 MED ORDER — ATORVASTATIN CALCIUM 40 MG PO TABS: 40.0000 mg | ORAL_TABLET | Freq: Every day | ORAL | 3 refills | Status: DC

## 2020-01-14 MED ORDER — ASPIRIN 81 MG PO TBEC: 81.0000 mg | DELAYED_RELEASE_TABLET | Freq: Every day | ORAL | 12 refills | Status: DC

## 2020-01-14 NOTE — Progress Notes (Signed)
Patient notified of results & recommendations. Expressed understanding.  Made a lab appointment for 01/15/20 @ 9:30 AM.

## 2020-01-15 ENCOUNTER — Other Ambulatory Visit: Payer: Medicare HMO

## 2020-01-15 ENCOUNTER — Other Ambulatory Visit: Payer: Self-pay

## 2020-01-15 DIAGNOSIS — E871 Hypo-osmolality and hyponatremia: Secondary | ICD-10-CM

## 2020-01-15 NOTE — Progress Notes (Signed)
Patient here for repeat labs. 

## 2020-01-16 LAB — BASIC METABOLIC PANEL
BUN/Creatinine Ratio: 8 — ABNORMAL LOW (ref 10–24)
BUN: 5 mg/dL — ABNORMAL LOW (ref 8–27)
CO2: 24 mmol/L (ref 20–29)
Calcium: 9.5 mg/dL (ref 8.6–10.2)
Chloride: 86 mmol/L — ABNORMAL LOW (ref 96–106)
Creatinine, Ser: 0.64 mg/dL — ABNORMAL LOW (ref 0.76–1.27)
GFR calc Af Amer: 115 mL/min/{1.73_m2} (ref >59)
GFR calc non Af Amer: 99 mL/min/{1.73_m2} (ref >59)
Glucose: 104 mg/dL — ABNORMAL HIGH (ref 65–99)
Potassium: 5.1 mmol/L (ref 3.5–5.2)
Sodium: 128 mmol/L — ABNORMAL LOW (ref 134–144)

## 2020-01-16 LAB — SODIUM, URINE, RANDOM: Sodium, Ur: 33 mmol/L

## 2020-01-16 LAB — OSMOLALITY, URINE: Osmolality, Ur: 314 mOsmol/kg

## 2020-01-18 ENCOUNTER — Telehealth: Payer: Self-pay

## 2020-01-18 ENCOUNTER — Ambulatory Visit (INDEPENDENT_AMBULATORY_CARE_PROVIDER_SITE_OTHER): Payer: Medicare HMO

## 2020-01-18 ENCOUNTER — Other Ambulatory Visit: Payer: Self-pay

## 2020-01-18 DIAGNOSIS — E871 Hypo-osmolality and hyponatremia: Secondary | ICD-10-CM

## 2020-01-18 MED ORDER — SODIUM CHLORIDE 0.9 % IV SOLN: Freq: Once | INTRAVENOUS | Status: AC

## 2020-01-18 NOTE — Progress Notes (Signed)
Patient notified of results & recommendations. Expressed understanding.

## 2020-01-18 NOTE — Telephone Encounter (Signed)
Called patient to go over low sodium results & to get him in office for a NS IV.  Left voicemail to call back.

## 2020-01-18 NOTE — Progress Notes (Signed)
Patient here for IV fluids. IV started by RN from Urgent Care at [facility].

## 2020-01-18 NOTE — Progress Notes (Signed)
Noted that patient's Na still low. Given urine Na and urine osmolality results, patient will need to come to clinic for 1L NS infusion to ensure receives >150 mEq of Na over 24 hours. Will then need to return next day for repeat urine studies and BMET. Allyne Gee is aware of this plan and is attempting to call patient to coordinate.   Phill Myron, D.O. Primary Care at [facility]  01/18/2020, 10:01 AM

## 2020-01-18 NOTE — Telephone Encounter (Signed)
Spoke with patient. He is scheduled for a nurse visit on 01/18/2020 @ 10:30 AM. IV will be started by RN from [place] at [facility]

## 2020-01-19 ENCOUNTER — Other Ambulatory Visit: Payer: Medicare HMO

## 2020-01-19 DIAGNOSIS — E871 Hypo-osmolality and hyponatremia: Secondary | ICD-10-CM | POA: Diagnosis not present

## 2020-01-19 NOTE — Progress Notes (Signed)
Patient here for repeat BMP 

## 2020-01-20 LAB — BASIC METABOLIC PANEL
BUN/Creatinine Ratio: 13 (ref 10–24)
BUN: 9 mg/dL (ref 8–27)
CO2: 21 mmol/L (ref 20–29)
Calcium: 9.5 mg/dL (ref 8.6–10.2)
Chloride: 88 mmol/L — ABNORMAL LOW (ref 96–106)
Creatinine, Ser: 0.67 mg/dL — ABNORMAL LOW (ref 0.76–1.27)
GFR calc Af Amer: 113 mL/min/{1.73_m2} (ref >59)
GFR calc non Af Amer: 97 mL/min/{1.73_m2} (ref >59)
Glucose: 117 mg/dL — ABNORMAL HIGH (ref 65–99)
Potassium: 4.4 mmol/L (ref 3.5–5.2)
Sodium: 127 mmol/L — ABNORMAL LOW (ref 134–144)

## 2020-01-21 ENCOUNTER — Other Ambulatory Visit: Payer: Self-pay | Admitting: Internal Medicine

## 2020-01-21 DIAGNOSIS — E871 Hypo-osmolality and hyponatremia: Secondary | ICD-10-CM

## 2020-01-25 ENCOUNTER — Telehealth: Payer: Self-pay | Admitting: Internal Medicine

## 2020-01-25 NOTE — Telephone Encounter (Signed)
Documentation on result note.

## 2020-01-25 NOTE — Telephone Encounter (Signed)
Wife called in regards to patient test results.  Please contact patient at earliest convenience.

## 2020-01-31 ENCOUNTER — Ambulatory Visit: Payer: Medicare HMO | Attending: Internal Medicine

## 2020-01-31 DIAGNOSIS — Z23 Encounter for immunization: Secondary | ICD-10-CM | POA: Insufficient documentation

## 2020-01-31 NOTE — Progress Notes (Signed)
   Covid-19 Vaccination Clinic  Name:  Brittany Pierrelouis    MRN: [id number] DOB: 1950/12/29  01/31/2020  Mr. Decicco was observed post Covid-19 immunization for 15 minutes without incidence. He was provided with Vaccine Information Sheet and instruction to access the V-Safe system.   Mr. Drayton was instructed to call 911 with any severe reactions post vaccine: Marland Kitchen Difficulty breathing  . Swelling of your face and throat  . A fast heartbeat  . A bad rash all over your body  . Dizziness and weakness    Immunizations Administered    Name Date Dose VIS Date Route   Pfizer COVID-19 Vaccine 01/31/2020 11:21 AM 0.3 mL 11/20/2019 Intramuscular   Manufacturer: [place]   Lot: [id number]   [place]: [id number]

## 2020-02-01 DIAGNOSIS — K4020 Bilateral inguinal hernia, without obstruction or gangrene, not specified as recurrent: Secondary | ICD-10-CM | POA: Diagnosis not present

## 2020-02-01 DIAGNOSIS — R69 Illness, unspecified: Secondary | ICD-10-CM | POA: Diagnosis not present

## 2020-02-08 DIAGNOSIS — L309 Dermatitis, unspecified: Secondary | ICD-10-CM | POA: Diagnosis not present

## 2020-02-24 ENCOUNTER — Ambulatory Visit: Payer: Medicare HMO | Attending: Internal Medicine

## 2020-02-24 DIAGNOSIS — Z23 Encounter for immunization: Secondary | ICD-10-CM

## 2020-02-24 NOTE — Progress Notes (Signed)
   Covid-19 Vaccination Clinic  Name:  Glenn Blevins    MRN: [id number] DOB: 11-19-1951  02/24/2020  [name] was observed post Covid-19 immunization for 15 minutes without incident. He was provided with Vaccine Information Sheet and instruction to access the V-Safe system.   Mr. Roll was instructed to call 911 with any severe reactions post vaccine: Marland Kitchen Difficulty breathing  . Swelling of face and throat  . A fast heartbeat  . A bad rash all over body  . Dizziness and weakness   Immunizations Administered    Name Date Dose VIS Date Route   Pfizer COVID-19 Vaccine 02/24/2020  8:40 AM 0.3 mL 11/20/2019 Intramuscular   Manufacturer: [place]   Lot: [id number]   [place]: [id number]

## 2020-03-15 ENCOUNTER — Other Ambulatory Visit: Payer: Self-pay

## 2020-03-15 ENCOUNTER — Ambulatory Visit (INDEPENDENT_AMBULATORY_CARE_PROVIDER_SITE_OTHER): Payer: Medicare HMO | Admitting: Ophthalmology

## 2020-03-15 ENCOUNTER — Encounter (INDEPENDENT_AMBULATORY_CARE_PROVIDER_SITE_OTHER): Payer: Self-pay | Admitting: Ophthalmology

## 2020-03-15 DIAGNOSIS — H353221 Exudative age-related macular degeneration, left eye, with active choroidal neovascularization: Secondary | ICD-10-CM | POA: Diagnosis not present

## 2020-06-10 ENCOUNTER — Ambulatory Visit: Payer: Self-pay | Admitting: Surgery

## 2020-06-10 DIAGNOSIS — K4020 Bilateral inguinal hernia, without obstruction or gangrene, not specified as recurrent: Secondary | ICD-10-CM | POA: Diagnosis not present

## 2020-06-10 DIAGNOSIS — R69 Illness, unspecified: Secondary | ICD-10-CM | POA: Diagnosis not present

## 2020-06-10 NOTE — H&P (Signed)
History of Present Illness Imogene Burn. Liston Thum MD; 06/10/2020 12:28 PM) The patient is a 69 year old male who presents with an inguinal hernia. Referred by Hosie Poisson, DO for left inguinal hernia  This is a 69 year old male in reasonably good health who presents with at least 5 year history of an enlarging left inguinal hernia. This does not cause much pain but this has become very large and extends down into his left scrotum. He denies any symptoms on his right side. He denies any obstructive symptoms. He states that he is able to push the hernia back in when he is supine.  He was originally seen in February of this year. However he had to delay scheduling surgery due to health issues with his wife as well as his mother. He comes in today to discuss planning surgery for later in August.   Problem List/Past Medical Imogene Burn. 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 Georgette Dover, MD; 06/10/2020 12:28 PM) Vasectomy  Diagnostic Studies History Imogene Burn. Katja Blue, MD; 06/10/2020 12:28 PM) Colonoscopy 1-5 years ago  Allergies Rodman Key K. Yafet Cline, MD; 06/10/2020 12:28 PM) No Known Drug Allergies [02/01/2020]: Allergies Reconciled No Known Allergies [02/01/2020]:  Medication History Darden Palmer, RMA; 06/10/2020 11:00 AM) No Current Medications Medications Reconciled  Social History Imogene Burn. Kasaundra Fahrney, MD; 06/10/2020 12:28 PM) Alcohol use Moderate alcohol use. Caffeine use Coffee.    Vitals Lattie Haw Caldwell RMA; 06/10/2020 11:00 AM) 06/10/2020 11:00 AM Weight: 164.5 lb Height: 71in Body Surface Area: 1.94 m Body Mass Index: 22.94 kg/m  Temp.: 98.77F  Pulse: 108 (Regular)  BP: 150/90(Sitting, Left Arm, Standard)        Physical Exam Rodman Key K. Nioka Thorington MD; 06/10/2020 12:29 PM)  The physical exam findings are as follows: Note:Constitutional: WDWN in NAD,  conversant, no obvious deformities; lying in bed comfortably Eyes: Pupils equal, round; sclera anicteric; moist conjunctiva; no lid lag HENT: Oral mucosa moist; good dentition Neck: No masses palpated, trachea midline; no thyromegaly Lungs: CTA bilaterally; normal respiratory effort CV: Regular rate and rhythm; no murmurs; extremities well-perfused with no edema Abd: +bowel sounds, soft, non-tender, no palpable organomegaly; no palpable umbilical hernias GU: Descended testes, no testicular masses, very large left inguinal hernia extending down into the scrotum. This is reducible when he is supine Patient has a small reducible right inguinal hernia On the anterior surface of the shaft of his penis, there is a 1 cm verrucous lesion Musc: Unable to assess gait; no apparent clubbing or cyanosis in extremities Lymphatic: No palpable cervical or axillary lymphadenopathy Skin: Warm, dry; no sign of jaundice Psychiatric - alert and oriented x 4; calm mood and affect    Assessment & Plan Rodman Key K. 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 The surgical procedure has been discussed with the patient. Potential risks, benefits, alternative treatments, and expected outcomes have been explained. All of the patient's questions at this time have been answered. The likelihood of reaching the patient's treatment goal is good. The patient understand the proposed surgical procedure and wishes to proceed.  Imogene Burn. Georgette Dover, MD, [facility]  General/ Trauma Surgery   06/10/2020 12:29 PM

## 2020-07-08 DIAGNOSIS — Z01818 Encounter for other preprocedural examination: Secondary | ICD-10-CM | POA: Diagnosis not present

## 2020-07-12 DIAGNOSIS — K4090 Unilateral inguinal hernia, without obstruction or gangrene, not specified as recurrent: Secondary | ICD-10-CM | POA: Diagnosis not present

## 2020-07-12 DIAGNOSIS — G8918 Other acute postprocedural pain: Secondary | ICD-10-CM | POA: Diagnosis not present

## 2020-07-12 DIAGNOSIS — R69 Illness, unspecified: Secondary | ICD-10-CM | POA: Diagnosis not present

## 2020-07-12 DIAGNOSIS — K4020 Bilateral inguinal hernia, without obstruction or gangrene, not specified as recurrent: Secondary | ICD-10-CM | POA: Diagnosis not present

## 2020-07-12 DIAGNOSIS — A630 Anogenital (venereal) warts: Secondary | ICD-10-CM | POA: Diagnosis not present

## 2020-07-14 ENCOUNTER — Encounter (INDEPENDENT_AMBULATORY_CARE_PROVIDER_SITE_OTHER): Payer: Medicare HMO | Admitting: Ophthalmology

## 2020-08-24 ENCOUNTER — Ambulatory Visit (INDEPENDENT_AMBULATORY_CARE_PROVIDER_SITE_OTHER): Payer: Medicare HMO | Admitting: Ophthalmology

## 2020-08-24 ENCOUNTER — Other Ambulatory Visit: Payer: Self-pay

## 2020-08-24 ENCOUNTER — Encounter (INDEPENDENT_AMBULATORY_CARE_PROVIDER_SITE_OTHER): Payer: Self-pay | Admitting: Ophthalmology

## 2020-08-24 DIAGNOSIS — Z8669 Personal history of other diseases of the nervous system and sense organs: Secondary | ICD-10-CM | POA: Insufficient documentation

## 2020-08-24 DIAGNOSIS — H353222 Exudative age-related macular degeneration, left eye, with inactive choroidal neovascularization: Secondary | ICD-10-CM

## 2020-08-24 NOTE — Patient Instructions (Signed)
Patient asked to promptly notify us should new visual acuity decline or distortion develop Reminded not to sleep on supine on the back

## 2020-08-24 NOTE — Progress Notes (Signed)
08/24/2020     CHIEF COMPLAINT Patient presents for Retina Follow Up   HISTORY OF PRESENT ILLNESS: Glenn Blevins is a 69 y.o. male who presents to the clinic today for:   HPI    Retina Follow Up    Patient presents with  Wet AMD.  In left eye.  This started 5 months ago.  Severity is mild.  Duration of 5 months.  Since onset it is stable.          Comments    5 Month AMD F/U OS  Pt denies noticeable changes to [place] OU since last visit. Pt denies ocular pain, flashes of light, or floaters OU.         Last edited by Rockie Neighbours, [place] on 08/24/2020  1:05 PM. (History)      Referring physician: Nicolette Bang, [place],  [place] [id number]  HISTORICAL INFORMATION:   Selected notes from the MEDICAL RECORD NUMBER       CURRENT MEDICATIONS: No current outpatient medications on file. (Ophthalmic Drugs)   No current facility-administered medications for this visit. (Ophthalmic Drugs)   Current Outpatient Medications (Other)  Medication Sig  . aspirin (EC-81 ASPIRIN) 81 MG EC tablet Take 1 tablet (81 mg total) by mouth daily. Swallow whole. (Patient not taking: Reported on 03/15/2020)  . atorvastatin (LIPITOR) 40 MG tablet Take 1 tablet (40 mg total) by mouth daily. (Patient not taking: Reported on 03/15/2020)   No current facility-administered medications for this visit. (Other)      REVIEW OF SYSTEMS:    ALLERGIES No Known Allergies  PAST MEDICAL HISTORY Past Medical History:  Diagnosis Date  . Psoriasis    Past Surgical History:  Procedure Laterality Date  . CATARACT EXTRACTION Bilateral unknown   Dr. Talbert Forest  . EYE SURGERY    . RETINAL DETACHMENT SURGERY Left     FAMILY HISTORY Family History  Problem Relation Age of Onset  . Amblyopia Neg Hx   . Blindness Neg Hx   . Cancer Neg Hx   . Cataracts Neg Hx   . Diabetes Neg Hx   . Glaucoma Neg Hx   . Hypertension Neg Hx   . Macular degeneration Neg Hx   . Retinal  detachment Neg Hx   . Strabismus Neg Hx   . Stroke Neg Hx   . Thyroid disease Neg Hx   . Retinitis pigmentosa Neg Hx     SOCIAL HISTORY Social History   Tobacco Use  . Smoking status: Never Smoker  . Smokeless tobacco: Never Used  Substance Use Topics  . Alcohol use: Yes  . Drug use: Not on file         OPHTHALMIC EXAM:  Base Eye Exam    Visual Acuity (ETDRS)      Right Left   Dist [place] 20/20 -2 E card @ 4'   Dist ph [place]  20/400ecc       Tonometry (Tonopen, 1:06 PM)      Right Left   Pressure 09 19       Pupils      Dark Light Shape React APD   Right 3 2 Round Brisk None   Left 3 3 Round Minimal +1       Visual Fields (Counting fingers)      Left Right     Full   Restrictions Total superior temporal, superior nasal, inferior nasal deficiencies        Extraocular Movement  Right Left    Full Full       Neuro/Psych    Oriented x3: Yes   Mood/Affect: Normal       Dilation    Left eye: 1.0% Mydriacyl, 2.5% Phenylephrine @ 1:09 PM        Slit Lamp and Fundus Exam    External Exam      Right Left   External Normal Normal       Slit Lamp Exam      Right Left   Lids/Lashes Normal Normal   Conjunctiva/Sclera White and quiet White and quiet   Cornea Clear Clear   Anterior Chamber Deep and quiet Deep and quiet   Iris Round and reactive Round and reactive   Lens Posterior chamber intraocular lens Posterior chamber intraocular lens   Anterior Vitreous Normal , clear oil.Normal       Fundus Exam      Right Left   Posterior Vitreous Normal , clear oil.Normal   Disc  Normal   C/D Ratio  0.5   Macula   attached   Vessels  Normal   Periphery  Retinal detachment, Rhegmatogenous retinal detachment, Tractional retinal detachment inf nasal to nerve, well demarcated,,, good laser ,, no new breaks or tears.          IMAGING AND PROCEDURES  Imaging and Procedures for 08/24/20  OCT, Retina - OU - Both Eyes       Right Eye Quality was good.  Scan locations included subfoveal. Central Foveal Thickness: 284. Progression has been stable.   Left Eye Quality was good. Scan locations included subfoveal. Central Foveal Thickness: 213. Progression has been stable.   Notes OD, normal with no active maculopathy  OS, diffuse retinal atrophy, retina detached in the posterior segment.                 ASSESSMENT/PLAN:  No problem-specific Assessment & Plan notes found for this encounter.      ICD-10-CM   1. Exudative age-related macular degeneration of left eye with inactive choroidal neovascularization (HCC)  H35.3222 OCT, Retina - OU - Both Eyes  2. History of retinal detachment  Z86.69     1.  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  2.  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  Ophthalmic Meds Ordered this visit:  No orders of the defined types were placed in this encounter.      Return in about 6 months (around 02/21/2021) for COLOR FP, DILATE OU.  Patient Instructions  Patient asked to promptly notify us should new visual acuity decline or distortion develop Reminded not to sleep on supine on the back    Explained the diagnoses, plan, and follow up with the patient and they expressed understanding.  Patient expressed understanding of the importance of proper follow up care.   Clent Demark Maurisio Ruddy M.D. Diseases & Surgery of the Retina and Vitreous Retina & Diabetic [place] 08/24/20     Abbreviations: M myopia (nearsighted); A astigmatism; H hyperopia (farsighted); P presbyopia; Mrx spectacle  prescription;  CTL contact lenses; OD right eye; OS left eye; OU both eyes  XT exotropia;  ET esotropia; PEK punctate epithelial keratitis; PEE punctate epithelial erosions; DES dry eye syndrome; MGD meibomian gland dysfunction; ATs artificial tears; PFAT's preservative free artificial tears; [place] nuclear sclerotic cataract; PSC posterior subcapsular cataract; ERM epi-retinal membrane; PVD posterior vitreous detachment; RD retinal detachment; DM diabetes mellitus; DR diabetic retinopathy; NPDR non-proliferative diabetic retinopathy; PDR proliferative diabetic retinopathy; CSME clinically significant macular edema; DME diabetic macular edema; dbh dot blot hemorrhages; CWS cotton wool spot; POAG primary open angle glaucoma; C/D cup-to-disc ratio; HVF humphrey visual field; GVF goldmann visual field; OCT optical coherence tomography; IOP intraocular pressure; BRVO Branch retinal vein occlusion; CRVO central retinal vein occlusion; CRAO central retinal artery occlusion; BRAO branch retinal artery occlusion; RT retinal tear; SB scleral buckle; PPV pars plana vitrectomy; VH Vitreous hemorrhage; PRP panretinal laser photocoagulation; IVK intravitreal kenalog; VMT vitreomacular traction; MH Macular hole;  NVD neovascularization of the disc; NVE neovascularization elsewhere; AREDS age related eye disease study; ARMD age related macular degeneration; POAG primary open angle glaucoma; EBMD epithelial/anterior basement membrane dystrophy; ACIOL anterior chamber intraocular lens; IOL intraocular lens; PCIOL posterior chamber intraocular lens; Phaco/IOL phacoemulsification with intraocular lens placement; [place] photorefractive keratectomy; LASIK laser assisted in situ keratomileusis; HTN hypertension; DM diabetes mellitus; COPD chronic obstructive pulmonary disease

## 2021-02-22 ENCOUNTER — Encounter (INDEPENDENT_AMBULATORY_CARE_PROVIDER_SITE_OTHER): Payer: Medicare HMO | Admitting: Ophthalmology

## 2021-03-02 ENCOUNTER — Other Ambulatory Visit: Payer: Self-pay

## 2021-03-02 ENCOUNTER — Encounter (INDEPENDENT_AMBULATORY_CARE_PROVIDER_SITE_OTHER): Payer: Self-pay | Admitting: Ophthalmology

## 2021-03-02 ENCOUNTER — Ambulatory Visit (INDEPENDENT_AMBULATORY_CARE_PROVIDER_SITE_OTHER): Payer: Medicare HMO | Admitting: Ophthalmology

## 2021-03-02 DIAGNOSIS — H353222 Exudative age-related macular degeneration, left eye, with inactive choroidal neovascularization: Secondary | ICD-10-CM | POA: Diagnosis not present

## 2021-03-02 MED ORDER — TIMOLOL MALEATE 0.5 % OP SOLN: 1.0000 [drp] | Freq: Two times a day (BID) | OPHTHALMIC | 1 refills | Status: DC

## 2021-03-02 MED ORDER — BRIMONIDINE TARTRATE 0.15 % OP SOLN: 1.0000 [drp] | Freq: Three times a day (TID) | OPHTHALMIC | 1 refills | Status: DC

## 2021-03-02 NOTE — Assessment & Plan Note (Signed)
This form of macular degeneration explained to the patient is related to peripheral retinal changes and does not pose a risk to the center of the vision in the left eye therefore no therapy ongoing

## 2021-03-02 NOTE — Progress Notes (Signed)
03/02/2021     CHIEF COMPLAINT Patient presents for Retina Follow Up (6 Mo F/U OU//Pt denies noticeable changes to [place] OU since last visit. Pt denies ocular pain, flashes of light, or floaters OU. //)   HISTORY OF PRESENT ILLNESS: Glenn Blevins is a 70 y.o. male who presents to the clinic today for:   HPI    Retina Follow Up    Patient presents with  Wet AMD.  In left eye.  This started 6 months ago.  Severity is moderate.  Duration of 6 months.  Since onset it is stable. Additional comments: 6 Mo F/U OU  Pt denies noticeable changes to [place] OU since last visit. Pt denies ocular pain, flashes of light, or floaters OU.          Last edited by Rockie Neighbours, [place] on 03/02/2021  2:45 PM. (History)      Referring physician: Nicolette Bang, [place],  [place] [id number]  HISTORICAL INFORMATION:   Selected notes from the [place]: No current outpatient medications on file. (Ophthalmic Drugs)   No current facility-administered medications for this visit. (Ophthalmic Drugs)   Current Outpatient Medications (Other)  Medication Sig  . aspirin (EC-81 ASPIRIN) 81 MG EC tablet Take 1 tablet (81 mg total) by mouth daily. Swallow whole. (Patient not taking: Reported on 03/15/2020)  . atorvastatin (LIPITOR) 40 MG tablet Take 1 tablet (40 mg total) by mouth daily. (Patient not taking: Reported on 03/15/2020)   No current facility-administered medications for this visit. (Other)      REVIEW OF SYSTEMS:    ALLERGIES No Known Allergies  PAST MEDICAL HISTORY Past Medical History:  Diagnosis Date  . Psoriasis    Past Surgical History:  Procedure Laterality Date  . CATARACT EXTRACTION Bilateral unknown   Dr. Talbert Forest  . EYE SURGERY    . RETINAL DETACHMENT SURGERY Left     FAMILY HISTORY Family History  Problem Relation Age of Onset  . Amblyopia Neg Hx   . Blindness Neg Hx   . Cancer Neg Hx   . Cataracts Neg Hx    . Diabetes Neg Hx   . Glaucoma Neg Hx   . Hypertension Neg Hx   . Macular degeneration Neg Hx   . Retinal detachment Neg Hx   . Strabismus Neg Hx   . Stroke Neg Hx   . Thyroid disease Neg Hx   . Retinitis pigmentosa Neg Hx     SOCIAL HISTORY Social History   Tobacco Use  . Smoking status: Never Smoker  . Smokeless tobacco: Never Used  Substance Use Topics  . Alcohol use: Yes         OPHTHALMIC EXAM: Base Eye Exam    Visual Acuity (ETDRS)      Right Left   Dist [place] 20/20 CF at 3'   Dist ph [place]  20/400ecc       Tonometry (Tonopen, 2:42 PM)      Right Left   Pressure 11 31       Tonometry #2 (Tonopen, 2:49 PM)      Right Left   Pressure  33       Pupils      Dark Light Shape React APD   Right 4.5 3.5 Round Brisk None   Left 4.5 4.5 Round Minimal None       Visual Fields (Counting fingers)      Left Right  Full   Restrictions Total superior temporal, superior nasal, inferior nasal deficiencies        Extraocular Movement      Right Left    Full Full       Neuro/Psych    Oriented x3: Yes   Mood/Affect: Normal       Dilation    Right eye: 1.0% Mydriacyl, 2.5% Phenylephrine @ 2:49 PM  Defer OS dilation due to high IOP        Slit Lamp and Fundus Exam    External Exam      Right Left   External Normal Normal       Slit Lamp Exam      Right Left   Lids/Lashes Normal Normal   Conjunctiva/Sclera White and quiet White and quiet   Cornea Clear Clear   Anterior Chamber Deep and quiet Deep and quiet   Iris Round and reactive Round and reactive   Lens Posterior chamber intraocular lens Posterior chamber intraocular lens, 3+ Posterior capsular opacification   Anterior Vitreous Normal , clear oil.Normal       Fundus Exam      Right Left   Posterior Vitreous Normal Silicone oil   Disc Normal    C/D Ratio 0.35    Macula Normal    Vessels Normal    Periphery Normal           IMAGING AND PROCEDURES  Imaging and Procedures for  03/02/21  Color Fundus Photography Optos - OU - Both Eyes       Right Eye Progression has been stable. Disc findings include normal observations. Macula : normal observations. Vessels : normal observations. Periphery : normal observations.   Left Eye Progression has been stable. Periphery : detachment.   Notes Inferonasal stable, well demarcated, good retinopexy with clear silicone oil.                  ASSESSMENT/PLAN:  Exudative age-related macular degeneration of left eye with inactive choroidal neovascularization ([place]) This form of macular degeneration explained to the patient is related to peripheral retinal changes and does not pose a risk to the center of the vision in the left eye therefore no therapy ongoing      ICD-10-CM   1. Exudative age-related macular degeneration of left eye with inactive choroidal neovascularization (HCC)  H35.3222 Color Fundus Photography Optos - OU - Both Eyes    1.  2.  3.  Ophthalmic Meds Ordered this visit:  No orders of the defined types were placed in this encounter.      Return in about 2 weeks (around 03/16/2021) for IOP check MD exam.  There are no Patient Instructions on file for this visit.   Explained the diagnoses, plan, and follow up with the patient and they expressed understanding.  Patient expressed understanding of the importance of proper follow up care.   Clent Demark Rankin M.D. Diseases & Surgery of the Retina and Vitreous Retina & Diabetic [place] 03/02/21     Abbreviations: M myopia (nearsighted); A astigmatism; H hyperopia (farsighted); P presbyopia; Mrx spectacle prescription;  CTL contact lenses; OD right eye; OS left eye; OU both eyes  XT exotropia; ET esotropia; PEK punctate epithelial keratitis; PEE punctate epithelial erosions; DES dry eye syndrome; MGD meibomian gland dysfunction; ATs artificial tears; PFAT's preservative free artificial tears; [place] nuclear sclerotic cataract; PSC posterior  subcapsular cataract; ERM epi-retinal membrane; PVD posterior vitreous detachment; RD retinal detachment; DM diabetes mellitus; DR diabetic retinopathy; NPDR non-proliferative  diabetic retinopathy; PDR proliferative diabetic retinopathy; CSME clinically significant macular edema; DME diabetic macular edema; dbh dot blot hemorrhages; CWS cotton wool spot; POAG primary open angle glaucoma; C/D cup-to-disc ratio; HVF humphrey visual field; GVF goldmann visual field; OCT optical coherence tomography; IOP intraocular pressure; BRVO Branch retinal vein occlusion; CRVO central retinal vein occlusion; CRAO central retinal artery occlusion; BRAO branch retinal artery occlusion; RT retinal tear; SB scleral buckle; PPV pars plana vitrectomy; VH Vitreous hemorrhage; PRP panretinal laser photocoagulation; IVK intravitreal kenalog; VMT vitreomacular traction; MH Macular hole;  NVD neovascularization of the disc; NVE neovascularization elsewhere; AREDS age related eye disease study; ARMD age related macular degeneration; POAG primary open angle glaucoma; EBMD epithelial/anterior basement membrane dystrophy; ACIOL anterior chamber intraocular lens; IOL intraocular lens; PCIOL posterior chamber intraocular lens; Phaco/IOL phacoemulsification with intraocular lens placement; [place] photorefractive keratectomy; LASIK laser assisted in situ keratomileusis; HTN hypertension; DM diabetes mellitus; COPD chronic obstructive pulmonary disease

## 2021-03-16 ENCOUNTER — Ambulatory Visit (INDEPENDENT_AMBULATORY_CARE_PROVIDER_SITE_OTHER): Payer: Medicare HMO | Admitting: Ophthalmology

## 2021-03-16 ENCOUNTER — Other Ambulatory Visit: Payer: Self-pay

## 2021-03-16 ENCOUNTER — Encounter (INDEPENDENT_AMBULATORY_CARE_PROVIDER_SITE_OTHER): Payer: Self-pay | Admitting: Ophthalmology

## 2021-03-16 DIAGNOSIS — H26492 Other secondary cataract, left eye: Secondary | ICD-10-CM | POA: Diagnosis not present

## 2021-03-16 DIAGNOSIS — H4052X2 Glaucoma secondary to other eye disorders, left eye, moderate stage: Secondary | ICD-10-CM

## 2021-03-16 NOTE — Progress Notes (Signed)
03/16/2021     CHIEF COMPLAINT Patient presents for IOP Check (/2 Wk IOP Check, no dilate//Pt denies noticeable changes to [place] OU since last visit. Pt denies ocular pain, flashes of light, or floaters OU. //)   HISTORY OF PRESENT ILLNESS: Glenn Blevins is a 70 y.o. male who presents to the clinic today for:   HPI    IOP Check     Additional comments:  2 Wk IOP Check, no dilate  Pt denies noticeable changes to [place] OU since last visit. Pt denies ocular pain, flashes of light, or floaters OU.          Last edited by Rockie Neighbours, [place] on 03/16/2021  1:27 PM. (History)      Referring physician: Nicolette Bang, [place],   [id number]  HISTORICAL INFORMATION:   Selected notes from the MEDICAL RECORD NUMBER       CURRENT MEDICATIONS: Current Outpatient Medications (Ophthalmic Drugs)  Medication Sig  . brimonidine (ALPHAGAN) 0.15 % ophthalmic solution Place 1 drop into the left eye every 8 (eight) hours.  . timolol (TIMOPTIC) 0.5 % ophthalmic solution Place 1 drop into the left eye 2 (two) times daily.   No current facility-administered medications for this visit. (Ophthalmic Drugs)   Current Outpatient Medications (Other)  Medication Sig  . aspirin (EC-81 ASPIRIN) 81 MG EC tablet Take 1 tablet (81 mg total) by mouth daily. Swallow whole. (Patient not taking: Reported on 03/15/2020)  . atorvastatin (LIPITOR) 40 MG tablet Take 1 tablet (40 mg total) by mouth daily. (Patient not taking: Reported on 03/15/2020)   No current facility-administered medications for this visit. (Other)      REVIEW OF SYSTEMS:    ALLERGIES No Known Allergies  PAST MEDICAL HISTORY Past Medical History:  Diagnosis Date  . Psoriasis    Past Surgical History:  Procedure Laterality Date  . CATARACT EXTRACTION Bilateral unknown   Dr. Talbert Forest  . EYE SURGERY    . RETINAL DETACHMENT SURGERY Left     FAMILY HISTORY Family History  Problem Relation Age of Onset   . Amblyopia Neg Hx   . Blindness Neg Hx   . Cancer Neg Hx   . Cataracts Neg Hx   . Diabetes Neg Hx   . Glaucoma Neg Hx   . Hypertension Neg Hx   . Macular degeneration Neg Hx   . Retinal detachment Neg Hx   . Strabismus Neg Hx   . Stroke Neg Hx   . Thyroid disease Neg Hx   . Retinitis pigmentosa Neg Hx     SOCIAL HISTORY Social History   Tobacco Use  . Smoking status: Never Smoker  . Smokeless tobacco: Never Used  Substance Use Topics  . Alcohol use: Yes         OPHTHALMIC EXAM: Base Eye Exam    Visual Acuity (ETDRS)      Right Left   Dist [place] 20/20 20/400ecc   Dist ph [place]  NI       Tonometry (Tonopen, 1:28 PM)      Right Left   Pressure 09 10       Neuro/Psych    Oriented x3: Yes   Mood/Affect: Normal       Dilation   Defer per GAR        Slit Lamp and Fundus Exam    External Exam      Right Left   External Normal Normal  Slit Lamp Exam      Right Left   Lids/Lashes Normal Normal   Conjunctiva/Sclera White and quiet White and quiet   Cornea Clear Clear   Anterior Chamber Deep and quiet Deep and quiet   Iris Round and reactive Round and reactive   Lens Posterior chamber intraocular lens Posterior chamber intraocular lens, 3+ Posterior capsular opacification   Anterior Vitreous Normal , clear oil.Normal          IMAGING AND PROCEDURES  Imaging and Procedures for 03/16/21           ASSESSMENT/PLAN:  Secondary glaucoma, left, moderate stage Intraocular pressure today now normalized on 2 medications we will continue  Posterior capsular opacification non visually significant of left eye Central dense opacification of the capsule, yet we will leave the capsule undisturbed until unable to medically evaluate the posterior pole.  At that time YAG capsulotomy may be appropriate.      ICD-10-CM   1. Secondary glaucoma, left, moderate stage  H40.52X2   2. Posterior capsular opacification non visually significant of left eye  H26.492      1.  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  Posterior capsule opacity left eye centered yet the PC intact preserves a barrier between the vitreous oral cavity and anterior chamber.  Will observe until medical monitoring of the posterior pole no longer possible and then YAG capsulotomy  3.  Ophthalmic Meds Ordered this visit:  No orders of the defined types were placed in this encounter.      Return in about 6 months (around 09/15/2021) for DILATE OU, COLOR FP.  There are no Patient Instructions on file for this visit.   Explained the diagnoses, plan, and follow up with the patient and they expressed understanding.  Patient expressed understanding of the importance of proper follow up care.   Clent Demark Irby Fails M.D. Diseases & Surgery of the Retina and Vitreous Retina & Diabetic [place] 03/16/21     Abbreviations: M myopia (nearsighted); A astigmatism; H hyperopia (farsighted); P presbyopia; Mrx spectacle prescription;  CTL contact lenses; OD right eye; OS left eye; OU both eyes  XT exotropia; ET esotropia; PEK punctate epithelial keratitis; PEE punctate epithelial erosions; DES dry eye syndrome; MGD meibomian gland dysfunction; ATs artificial tears; PFAT's preservative free artificial tears; [place] nuclear sclerotic cataract; PSC posterior subcapsular cataract; ERM epi-retinal membrane; PVD posterior vitreous detachment; RD retinal detachment; DM diabetes mellitus; DR diabetic retinopathy; NPDR non-proliferative diabetic retinopathy; PDR proliferative diabetic retinopathy; CSME clinically significant macular edema; DME diabetic macular edema; dbh dot blot hemorrhages; CWS cotton wool spot; POAG primary open angle glaucoma; C/D cup-to-disc ratio; HVF humphrey visual  field; GVF goldmann visual field; OCT optical coherence tomography; IOP intraocular pressure; BRVO Branch retinal vein occlusion; CRVO central retinal vein occlusion; CRAO central retinal artery occlusion; BRAO branch retinal artery occlusion; RT retinal tear; SB scleral buckle; PPV pars plana vitrectomy; VH Vitreous hemorrhage; PRP panretinal laser photocoagulation; IVK intravitreal kenalog; VMT vitreomacular traction; MH Macular hole;  NVD neovascularization of the disc; NVE neovascularization elsewhere; AREDS age related eye disease study; ARMD age related macular degeneration; POAG primary open angle glaucoma; EBMD epithelial/anterior basement membrane dystrophy; ACIOL anterior chamber intraocular lens; IOL intraocular lens; PCIOL posterior chamber intraocular lens; Phaco/IOL phacoemulsification with intraocular lens placement; PRK photorefractive  keratectomy; LASIK laser assisted in situ keratomileusis; HTN hypertension; DM diabetes mellitus; COPD chronic obstructive pulmonary disease

## 2021-03-16 NOTE — Assessment & Plan Note (Signed)
Intraocular pressure today now normalized on 2 medications we will continue

## 2021-03-16 NOTE — Assessment & Plan Note (Signed)
Central dense opacification of the capsule, yet we will leave the capsule undisturbed until unable to medically evaluate the posterior pole.  At that time YAG capsulotomy may be appropriate.

## 2021-04-25 ENCOUNTER — Ambulatory Visit: Payer: Medicare HMO | Admitting: Internal Medicine

## 2021-04-26 ENCOUNTER — Other Ambulatory Visit (INDEPENDENT_AMBULATORY_CARE_PROVIDER_SITE_OTHER): Payer: Self-pay | Admitting: Ophthalmology

## 2021-05-02 ENCOUNTER — Other Ambulatory Visit: Payer: Self-pay

## 2021-05-02 ENCOUNTER — Encounter: Payer: Self-pay | Admitting: Internal Medicine

## 2021-05-02 ENCOUNTER — Ambulatory Visit (INDEPENDENT_AMBULATORY_CARE_PROVIDER_SITE_OTHER): Payer: Medicare HMO | Admitting: Internal Medicine

## 2021-05-02 VITALS — BP 170/108 | HR 111 | Temp 97.3°F | Resp 16 | Wt 172.0 lb

## 2021-05-02 DIAGNOSIS — M25562 Pain in left knee: Secondary | ICD-10-CM | POA: Diagnosis not present

## 2021-05-02 DIAGNOSIS — M25561 Pain in right knee: Secondary | ICD-10-CM

## 2021-05-02 DIAGNOSIS — G8929 Other chronic pain: Secondary | ICD-10-CM | POA: Diagnosis not present

## 2021-05-02 DIAGNOSIS — R030 Elevated blood-pressure reading, without diagnosis of hypertension: Secondary | ICD-10-CM

## 2021-05-02 NOTE — Progress Notes (Signed)
  Subjective:    Glenn Blevins - 70 y.o. male MRN [id number]  Date of birth: [birth year]  HPI  Glenn Blevins is here for knee pain. Reports has been chronic, but has noticed it more this past year. Bilaterally. Reports pain and generalized soreness. Has been "slowing him down". No locking, popping, grinding. No legs giving out. Interested in discussing options such as injections or knee replacement surgery.      Health Maintenance:  Health Maintenance Due  Topic Date Due  . PNA vac Low Risk Adult (1 of 2 - PCV13) Never done  . COLONOSCOPY (Pts 45-52yrs Insurance coverage will need to be confirmed)  12/29/2014  . COVID-19 Vaccine (3 - Booster for Pfizer series) 07/26/2020  . TETANUS/TDAP  12/10/2020    -  reports that he has never smoked. He has never used smokeless tobacco. - Review of Systems: Per HPI. - Past Medical History: Patient Active Problem List   Diagnosis Date Noted  . Secondary glaucoma, left, moderate stage 03/16/2021  . Posterior capsular opacification non visually significant of left eye 03/16/2021  . History of retinal detachment 08/24/2020  . Exudative age-related macular degeneration of left eye with inactive choroidal neovascularization ([place]) 08/24/2020  . Hyponatremia 01/14/2020   - Medications: reviewed and updated   Objective:   Physical Exam BP (!) 176/108   Pulse (!) 112   Temp (!) 97.3 F (36.3 C)   Resp 16   Wt 172 lb (78 kg)   SpO2 95%   BMI 30.47 kg/m  Physical Exam Constitutional:      General: He is not in acute distress.    Appearance: He is not diaphoretic.  Cardiovascular:     Rate and Rhythm: Normal rate.  Pulmonary:     Effort: Pulmonary effort is normal. No respiratory distress.  Musculoskeletal:     Comments: Crepitus at knees bilaterally. Anterior drawer test neg. No joint line TTP.   Skin:    General: Skin is warm and dry.  Neurological:     Mental Status: He is alert and oriented to person, place, and time.   Psychiatric:        Mood and Affect: Affect normal.        Judgment: Judgment normal.            Assessment & Plan:   1. Chronic pain of both knees Suspect OA given history of arthritis in other joints, age, and chronicity bilaterally. Discussed OTC pain relief medications and topical gels. Will defer imaging given orthopedics referral placed. Discussed options that orthopedics may offer if OA is indeed diagnosed.  - AMB referral to orthopedics  2. Elevated blood pressure reading without diagnosis of hypertension Patient reports he has a history of whitecoat HTN. States monitors at home and all within normal range. Asymptomatic. Will need to monitor closely.    Phill Myron, D.O. 05/02/2021, 2:02 PM Primary [facility] at [facility]

## 2021-05-02 NOTE — Progress Notes (Signed)
Has questions r/t knee pain

## 2021-05-09 ENCOUNTER — Other Ambulatory Visit (INDEPENDENT_AMBULATORY_CARE_PROVIDER_SITE_OTHER): Payer: Self-pay | Admitting: Ophthalmology

## 2021-05-10 ENCOUNTER — Other Ambulatory Visit: Payer: Self-pay

## 2021-05-10 ENCOUNTER — Ambulatory Visit: Payer: Medicare HMO | Admitting: Family Medicine

## 2021-05-10 ENCOUNTER — Other Ambulatory Visit (INDEPENDENT_AMBULATORY_CARE_PROVIDER_SITE_OTHER): Payer: Self-pay | Admitting: Ophthalmology

## 2021-05-10 DIAGNOSIS — M1712 Unilateral primary osteoarthritis, left knee: Secondary | ICD-10-CM | POA: Diagnosis not present

## 2021-05-10 DIAGNOSIS — M1711 Unilateral primary osteoarthritis, right knee: Secondary | ICD-10-CM | POA: Diagnosis not present

## 2021-05-10 MED ORDER — TIMOLOL MALEATE 0.5 % OP SOLN: OPHTHALMIC | 12 refills | Status: AC

## 2021-05-10 NOTE — Progress Notes (Signed)
Office Visit Note   Patient: Glenn Blevins           Date of Birth: June 06, 1951           MRN: [id number] Visit Date: 05/10/2021 Requested by: Nicolette Bang, DO [place],  [place] [address] PCP: Nicolette Bang, DO  Subjective: Chief Complaint  Patient presents with  . Left Knee - Pain    Chronic pain in both knees, left more than right. Worsening. Knees are stiff first thing in the morning and after sitting a while. He does not want to have a knee replacement unless he absolutely has to. Is interested in steroid shots. Uses capsaicin cream and copperfit knee sleeves when he is doing yardwork. Has had no xrays.  . Right Knee - Pain    HPI: 70yo M presenting to clinic with bilateral (L>R) knee pain, which has insidiously worsened over the past few years. He says he enjoys yard work, and has noticed his knees are feeling increasingly stiff and painful, and it's starting to interfere with his daily activities. He has tried a copperfit sleeve as well as capsaicin cream, which offers some improvement. He is intimidated by the idea of knee replacements, and is curious about injections, as he has never had these before. States his knees are worse in the morning, and then will improve somewhat throughout the day. His left knee does hurt somewhat more than the right, but his right is very bothersome as well. His left will occasionally 'pop,' but he says this isn't too common/bothersome. Denies any history of significant trauma to the knees.              Objective: Vital Signs: There were no vitals taken for this visit.  Physical Exam:  General:  Alert and oriented, in no acute distress. Pulm:  Breathing unlabored. Psy:  Normal mood, congruent affect. Skin:  Bilateral knees with no bruising, rashes, or erythema. Overlying skin intact.   Bilateral Knee Exam:  General: Walks stiffly, with minimal knee flexion during swing phase.  ROM: Reduced extension by  approximately 10* on right, 5* on left, though flexion does seem preserved bilaterally. Significant patellar crepitus bilaterally.   Palpation: Endorses tenderness over medial joint line bilaterally (L>R). There is mild discomfort with patellar compression as well, though no pain with palpation of lateral joint line.    Supine exam: No effusion, normal patellar mobility.   Ligamentous Exam:  No pain or laxity with anterior/posterior drawer.  No obvious Sag.  No pain or laxity with varus/valgus stress across the knee.   Meniscus:  McMurray with no pain, though he does have non-painful clicking appreciated on the left.  Thessaly negative.   Strength: Hip flexion (L1), Hip Aduction (L2), Knee Extension (L3) are 5/5 Bilaterally Foot Inversion (L4), Dorsiflexion (L5), and Eversion (S1) 5/5 Bilaterally  Sensation: Intact to light touch medial and lateral aspects of lower extremities, and lateral, dorsal, and medial aspects of foot.    Imaging: No results found.  Assessment & Plan: 70yo M presenting to clinic with atraumatic knee pain/stiffnes, consistent with Primary OA. Given interference with desired activities, discussed injection therapy today, and patient opted to try. Risks and benefits were discussed, as well as aftercare and return precautions.  - CSI performed as described below - If he notices inadequate response to steroid, could consider visco in the future - Patient had no further questions or concerns today.      Procedures: Bilateral Knee Cortisone Injection:  Risks and benefits of procedure discussed, Patient opted to proceed. Verbal Consent obtained.  Timeout performed.  Skin prepped in a sterile fashion with betadine before further cleansing with alcohol. Ethyl Chloride was used for topical analgesia.  Right Knee was injected with 3cc 1% Lidocaine without epinephrine via the suprapatellar approach using a 25G, 1.5in needle. Syringe was removed from the needle, and  6mg  Betamethasone was then injected into the joint.  Procedure was then repeated on the left knee.  Patient tolerated the injection well with no immediate complications. Aftercare instructions were discussed, and patient was given strict return precautions.      PMFS History: Patient Active Problem List   Diagnosis Date Noted  . Secondary glaucoma, left, moderate stage 03/16/2021  . Posterior capsular opacification non visually significant of left eye 03/16/2021  . History of retinal detachment 08/24/2020  . 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 Hyponatremia 01/14/2020   Past Medical History:  Diagnosis Date  . Psoriasis     Family History  Problem Relation Age of Onset  . Amblyopia Neg Hx   . Blindness Neg Hx   . Cancer Neg Hx   . Cataracts Neg Hx   . Diabetes Neg Hx   . Glaucoma Neg Hx   . Hypertension Neg Hx   . Macular degeneration Neg Hx   . Retinal detachment Neg Hx   . Strabismus Neg Hx   . Stroke Neg Hx   . Thyroid disease Neg Hx   . Retinitis pigmentosa Neg Hx     Past Surgical History:  Procedure Laterality Date  . CATARACT EXTRACTION Bilateral unknown   Dr. Talbert Forest  . EYE SURGERY    . RETINAL DETACHMENT SURGERY Left    Social History   Occupational History  . Not on file  Tobacco Use  . Smoking status: Never Smoker  . Smokeless tobacco: Never Used  Substance and Sexual Activity  . Alcohol use: Yes  . Drug use: Not on file  . Sexual activity: Yes    Partners: Female    Birth control/protection: Post-menopausal, None

## 2021-05-10 NOTE — Progress Notes (Signed)
I saw and examined the patient with Dr. Elouise Munroe and agree with assessment and plan as outlined.    Bilateral knee pain, probably from DJD.  Doesn't want to consider surgery.  Would like to try injections.  Will inject with steroid today.  If only short-term relief, then x-rays and request approval for gel injections.

## 2021-05-29 ENCOUNTER — Telehealth: Payer: Self-pay | Admitting: Family Medicine

## 2021-05-29 ENCOUNTER — Encounter: Payer: Self-pay | Admitting: Family Medicine

## 2021-05-29 ENCOUNTER — Ambulatory Visit (INDEPENDENT_AMBULATORY_CARE_PROVIDER_SITE_OTHER): Payer: Medicare HMO

## 2021-05-29 ENCOUNTER — Ambulatory Visit: Payer: Self-pay

## 2021-05-29 ENCOUNTER — Ambulatory Visit (INDEPENDENT_AMBULATORY_CARE_PROVIDER_SITE_OTHER): Payer: Medicare HMO | Admitting: Family Medicine

## 2021-05-29 DIAGNOSIS — M1712 Unilateral primary osteoarthritis, left knee: Secondary | ICD-10-CM

## 2021-05-29 DIAGNOSIS — M1711 Unilateral primary osteoarthritis, right knee: Secondary | ICD-10-CM

## 2021-05-29 NOTE — Progress Notes (Signed)
Office Visit Note   Patient: Glenn Blevins           Date of Birth: [birth year]           MRN: [id number] Visit Date: 05/29/2021 Requested by: Nicolette Bang, MD [address] [place],  [place] [address] PCP: Nicolette Bang, MD  Subjective: Chief Complaint  Patient presents with   Right Knee - Follow-up, Pain    Had very little relief from bilateral cortisone injections on 05/10/21.   Left Knee - Follow-up, Pain    HPI: He is here with persistent bilateral knee pain.  Cortisone injections did not help.  No locking or giving way, but a lot of stiffness especially when starting the day.              ROS:   All other systems were reviewed and are negative.  Objective: Vital Signs: There were no vitals taken for this visit.  Physical Exam:  General:  Alert and oriented, in no acute distress. Pulm:  Breathing unlabored. Psy:  Normal mood, congruent affect. Skin: No erythema Knees: No effusions today.  Both knees are mildly tender on the medial joint line.  Pseudolaxity with valgus stress bilaterally.  No palpable click with McMurray's.    Imaging: XR Knee 1-2 Views Left  Result Date: 05/29/2021 knees reveal mild to moderate medial compartment joint space narrowing bilaterally.  No sign of loose body or stress fracture.  XR Knee 1-2 Views Right  Result Date: 05/29/2021 X-rays of both knees reveal mild to moderate medial compartment joint space narrowing bilaterally.  No sign of loose body or stress fracture.   Assessment & Plan: Bilateral knee pain, suspect due to DJD -Elected to request insurance approval for bilateral gel injections.  He will follow-up for injections once approved.  Could contemplate unloading braces in the future if he fails to get adequate relief.     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 03/16/2021   History of retinal detachment 08/24/2020   Exudative age-related macular degeneration of left eye with inactive choroidal neovascularization ([place]) 08/24/2020   Hyponatremia 01/14/2020   Past Medical History:  Diagnosis Date   Psoriasis     Family History  Problem Relation Age of Onset   Amblyopia Neg Hx    Blindness Neg Hx    Cancer Neg Hx    Cataracts Neg Hx    Diabetes Neg Hx    Glaucoma Neg Hx    Hypertension Neg Hx    Macular degeneration Neg Hx    Retinal detachment Neg Hx    Strabismus Neg Hx    Stroke Neg Hx    Thyroid disease Neg Hx    Retinitis pigmentosa Neg Hx     Past Surgical History:  Procedure Laterality Date   CATARACT EXTRACTION Bilateral unknown   Dr. Talbert Forest   EYE SURGERY     RETINAL DETACHMENT SURGERY Left    Social History   Occupational History   Not on file  Tobacco Use   Smoking status: Never   Smokeless tobacco: Never  Substance and Sexual Activity   Alcohol use: Yes   Drug use: Not on file   Sexual activity: Yes    Partners: Female    Birth control/protection: Post-menopausal, None

## 2021-05-29 NOTE — Telephone Encounter (Signed)
Requesting approval for bilateral knee gel injections for OA.

## 2021-05-30 NOTE — Telephone Encounter (Signed)
Noted  

## 2021-06-01 ENCOUNTER — Telehealth: Payer: Self-pay

## 2021-06-01 NOTE — Telephone Encounter (Signed)
VOB has been submitted for SynviscOne, left knee. Pending BV. 

## 2021-06-05 ENCOUNTER — Telehealth: Payer: Self-pay

## 2021-06-05 NOTE — Telephone Encounter (Signed)
PA required for SYnviscOne, left knee. Faxed completed PA form to [place] at [phone] PA Pending

## 2021-06-06 ENCOUNTER — Telehealth: Payer: Self-pay

## 2021-06-06 ENCOUNTER — Telehealth: Payer: Self-pay | Admitting: Family Medicine

## 2021-06-06 NOTE — Telephone Encounter (Signed)
Called and left a VM for patient to CB to schedule with Dr. Junius Roads for gel injection.  Approved, SynviscOne, left knee. [place] Patient will be responsible for 20% OOP. May have a Co-pay of $35.00 PA Approval# [id number] Valid 06/05/2021- 09/05/2021

## 2021-06-06 NOTE — Telephone Encounter (Signed)
Received call from patient's wife Glenn Blevins concerning making an appointment for the gel injections for Bil knees. In the system the approval was for left knee gel injection. Please let me know and I will call Lise back to schedule the appointment.     [phone]

## 2021-06-07 NOTE — Telephone Encounter (Signed)
Talked with Joycelyn Schmid concerning scheduling patient's appointment.  Will submit for right knee, SynviscOne.

## 2021-06-09 ENCOUNTER — Telehealth: Payer: Self-pay | Admitting: Family Medicine

## 2021-06-09 ENCOUNTER — Telehealth: Payer: Self-pay

## 2021-06-09 NOTE — Telephone Encounter (Signed)
Refaxed Office notes for PA.

## 2021-06-09 NOTE — Telephone Encounter (Signed)
I think this is regarding the knee injection PA

## 2021-06-09 NOTE — Telephone Encounter (Signed)
PA required for synviscOne, right knee. Faxed completed PA form to [place] at [phone].

## 2021-06-09 NOTE — Telephone Encounter (Signed)
[employer] called and need pain clinical notes.  CB [phone] [place] Gave

## 2021-06-14 ENCOUNTER — Telehealth: Payer: Self-pay

## 2021-06-14 NOTE — Telephone Encounter (Signed)
Approved for SynviscOne, right knee. [place] Patient will be responsible for 20% OOP. Co-pay of $35.00 required PA Approval# [id number] Valid 06/09/2021- 09/09/2021

## 2021-06-15 ENCOUNTER — Ambulatory Visit: Payer: Medicare HMO | Admitting: Family Medicine

## 2021-06-15 ENCOUNTER — Other Ambulatory Visit: Payer: Self-pay

## 2021-06-15 DIAGNOSIS — M1711 Unilateral primary osteoarthritis, right knee: Secondary | ICD-10-CM

## 2021-06-15 DIAGNOSIS — M1712 Unilateral primary osteoarthritis, left knee: Secondary | ICD-10-CM | POA: Diagnosis not present

## 2021-06-15 NOTE — Progress Notes (Signed)
Subjective: He is here for planned bilateral Synvisc 1 injections for knee osteoarthritis.  Objective: Trace effusion bilaterally with no warmth or erythema.  Procedure: Bilateral knee injections: After sterile prep with Betadine, injected 3 cc 1% lidocaine without epinephrine and Synvisc 1 from lateral midpatellar approach right knee and medial midpatellar approach left knee.  A flash of clear yellow synovial fluid was obtained prior to each injection.

## 2021-07-11 ENCOUNTER — Encounter: Payer: Self-pay | Admitting: Family Medicine

## 2021-07-11 ENCOUNTER — Ambulatory Visit (INDEPENDENT_AMBULATORY_CARE_PROVIDER_SITE_OTHER): Payer: Medicare HMO | Admitting: Family Medicine

## 2021-07-11 ENCOUNTER — Other Ambulatory Visit: Payer: Self-pay

## 2021-07-11 DIAGNOSIS — M1711 Unilateral primary osteoarthritis, right knee: Secondary | ICD-10-CM

## 2021-07-11 DIAGNOSIS — M1712 Unilateral primary osteoarthritis, left knee: Secondary | ICD-10-CM | POA: Diagnosis not present

## 2021-07-11 NOTE — Progress Notes (Signed)
   Office Visit Note   Patient: Glenn Blevins           Date of Birth: 01-12-1951           MRN: [id number] Visit Date: 07/11/2021 Requested by: Nicolette Bang, MD [address] [place],  [place] [address] PCP: Nicolette Bang, MD  Subjective: Chief Complaint  Patient presents with   Right Knee - Follow-up, Pain    S/p bilateral knee Synvisc One injections on 06/15/21. These did give much relief. What is the next step?   Left Knee - Follow-up, Pain    HPI: He is here with persistent bilateral knee pain.  Cortisone injections a few months ago did not help.  Gel injections were few weeks ago helped only for a week or 2.  He would like to try 1 more injection in each knee.  He very much would like to avoid surgery.                ROS:   All other systems were reviewed and are negative.  Objective: Vital Signs: There were no vitals taken for this visit.  Physical Exam:  General:  Alert and oriented, in no acute distress. Pulm:  Breathing unlabored. Psy:  Normal mood, congruent affect. Skin: No erythema Knees: Trace effusion.  He has extremely tight hamstrings bilaterally and difficulty fully extending his knees.    Imaging: No results found.  Assessment & Plan: Bilateral knee osteoarthritis -1 more steroid injection in each knee today.  If these do not give lasting relief, could contemplate medial compartment unloading braces.     Procedures: Bilateral knee injections: After sterile prep with Betadine, injected 3 cc 1% lidocaine without epinephrine and 40 mg Depo-Medrol from lateral midpatellar approach on the left and medial midpatellar approach on the right.       PMFS History: Patient Active Problem List   Diagnosis Date Noted   Secondary glaucoma, left, moderate stage 03/16/2021   Posterior capsular opacification non visually significant of left eye 03/16/2021   History of retinal detachment 08/24/2020   Exudative age-related  macular degeneration of left eye with inactive choroidal neovascularization ([place]) 08/24/2020   Hyponatremia 01/14/2020   Past Medical History:  Diagnosis Date   Psoriasis     Family History  Problem Relation Age of Onset   Amblyopia Neg Hx    Blindness Neg Hx    Cancer Neg Hx    Cataracts Neg Hx    Diabetes Neg Hx    Glaucoma Neg Hx    Hypertension Neg Hx    Macular degeneration Neg Hx    Retinal detachment Neg Hx    Strabismus Neg Hx    Stroke Neg Hx    Thyroid disease Neg Hx    Retinitis pigmentosa Neg Hx     Past Surgical History:  Procedure Laterality Date   CATARACT EXTRACTION Bilateral unknown   Dr. Talbert Forest   EYE SURGERY     RETINAL DETACHMENT SURGERY Left    Social History   Occupational History   Not on file  Tobacco Use   Smoking status: Never   Smokeless tobacco: Never  Substance and Sexual Activity   Alcohol use: Yes   Drug use: Not on file   Sexual activity: Yes    Partners: Female    Birth control/protection: Post-menopausal, None

## 2021-08-30 ENCOUNTER — Telehealth: Payer: Self-pay

## 2021-08-30 NOTE — Telephone Encounter (Signed)
Called to schedule AWV - left message to call PCP office.

## 2021-09-10 ENCOUNTER — Other Ambulatory Visit (INDEPENDENT_AMBULATORY_CARE_PROVIDER_SITE_OTHER): Payer: Self-pay | Admitting: Ophthalmology

## 2021-09-18 ENCOUNTER — Encounter (INDEPENDENT_AMBULATORY_CARE_PROVIDER_SITE_OTHER): Payer: Medicare HMO | Admitting: Ophthalmology

## 2021-10-10 ENCOUNTER — Ambulatory Visit (INDEPENDENT_AMBULATORY_CARE_PROVIDER_SITE_OTHER): Payer: Medicare HMO | Admitting: Family Medicine

## 2021-10-10 VITALS — BP 165/108 | HR 113 | Temp 98.9°F | Resp 16 | Wt 173.4 lb

## 2021-10-10 DIAGNOSIS — Z9189 Other specified personal risk factors, not elsewhere classified: Secondary | ICD-10-CM | POA: Diagnosis not present

## 2021-10-10 DIAGNOSIS — R69 Illness, unspecified: Secondary | ICD-10-CM | POA: Diagnosis not present

## 2021-10-10 DIAGNOSIS — F1020 Alcohol dependence, uncomplicated: Secondary | ICD-10-CM

## 2021-10-10 DIAGNOSIS — R030 Elevated blood-pressure reading, without diagnosis of hypertension: Secondary | ICD-10-CM

## 2021-10-10 MED ORDER — NALTREXONE HCL 50 MG PO TABS: 50.0000 mg | ORAL_TABLET | Freq: Every day | ORAL | 1 refills | Status: AC

## 2021-10-10 NOTE — Progress Notes (Signed)
Patient need to talk about medication to help with drinking. Patient going  to [employer] meeting

## 2021-10-10 NOTE — Progress Notes (Signed)
Established Patient Office Visit  Subjective:  Patient ID: Glenn Blevins, male    DOB: Mar 02, 1951  Age: 70 y.o. MRN: [id number]  CC:  Chief Complaint  Patient presents with   Follow-up   Medication Refill    HPI Glenn Blevins presents for follow up of hyperlipidemia. Patient also reports that he would like a medicine to help him decrease/cease alcohol consumption. He has now started going to [place]. He reports he drinks a 6 pack plus every evening.   Past Medical History:  Diagnosis Date   Psoriasis     Past Surgical History:  Procedure Laterality Date   CATARACT EXTRACTION Bilateral unknown   Dr. Talbert Forest   EYE SURGERY     RETINAL DETACHMENT SURGERY Left     Family History  Problem Relation Age of Onset   Amblyopia Neg Hx    Blindness Neg Hx    Cancer Neg Hx    Cataracts Neg Hx    Diabetes Neg Hx    Glaucoma Neg Hx    Hypertension Neg Hx    Macular degeneration Neg Hx    Retinal detachment Neg Hx    Strabismus Neg Hx    Stroke Neg Hx    Thyroid disease Neg Hx    Retinitis pigmentosa Neg Hx     Social History   Socioeconomic History   Marital status: Married    Spouse name: Not on file   Number of children: Not on file   Years of education: Not on file   Highest education level: Not on file  Occupational History   Not on file  Tobacco Use   Smoking status: Never   Smokeless tobacco: Never  Substance and Sexual Activity   Alcohol use: Yes   Drug use: Not on file   Sexual activity: Yes    Partners: Female    Birth control/protection: Post-menopausal, None  Other Topics Concern   Not on file  Social History Narrative   Not on file   Social Determinants of Health   Financial Resource Strain: Not on file  Food Insecurity: Not on file  Transportation Needs: Not on file  Physical Activity: Not on file  Stress: Not on file  Social Connections: Not on file  Intimate Partner Violence: Not on file    ROS Review of Systems  All other systems  reviewed and are negative.  Objective:   Today's Vitals: BP (!) 165/108   Pulse (!) 113   Temp 98.9 F (37.2 C) (Oral)   Resp 16   Wt 173 lb 6.4 oz (78.7 kg)   SpO2 93%   BMI 30.72 kg/m   Physical Exam Vitals and nursing note reviewed.  Constitutional:      General: He is not in acute distress. Cardiovascular:     Rate and Rhythm: Normal rate and regular rhythm.  Pulmonary:     Effort: Pulmonary effort is normal.     Breath sounds: Normal breath sounds.  Abdominal:     Palpations: Abdomen is soft.     Tenderness: There is no abdominal tenderness.  Musculoskeletal:     Right lower leg: No edema.     Left lower leg: No edema.  Neurological:     General: No focal deficit present.     Mental Status: He is alert and oriented to person, place, and time.    Assessment & Plan:   1. Elevated blood pressure reading without diagnosis of hypertension Elevated readings. Patient defers starting agent in spite of  multiple elevated readings blaming [place]. Will monitor. - Basic Metabolic Panel - Lipid Panel - AST - ALT  2. Candidate for statin therapy due to risk of future cardiovascular event Monitoring labs ordered. Continue present management  - Basic Metabolic Panel - Lipid Panel - AST - ALT  3. Uncomplicated alcohol dependence (HCC) Naltrexone prescribed. Encouraged to continue AA. Will monitor - naltrexone (DEPADE) 50 MG tablet; Take 1 tablet (50 mg total) by mouth daily.  Dispense: 30 tablet; Refill: 1    Outpatient Encounter Medications as of 10/10/2021  Medication Sig   naltrexone (DEPADE) 50 MG tablet Take 1 tablet (50 mg total) by mouth daily.   brimonidine (ALPHAGAN) 0.15 % ophthalmic solution PLACE 1 DROP INTO THE LEFT EYE EVERY 8 (EIGHT) HOURS.   timolol (TIMOPTIC) 0.5 % ophthalmic solution INSTILL 1 DROP INTO LEFT EYE TWICE A DAY   [DISCONTINUED] aspirin (EC-81 ASPIRIN) 81 MG EC tablet Take 1 tablet (81 mg total) by mouth daily. Swallow whole. (Patient not  taking: No sig reported)   [DISCONTINUED] atorvastatin (LIPITOR) 40 MG tablet Take 1 tablet (40 mg total) by mouth daily. (Patient not taking: No sig reported)   No facility-administered encounter medications on file as of 10/10/2021.    Follow-up: Return in about 4 weeks (around 11/07/2021) for follow up, chronic med issues.   Becky Sax, MD

## 2021-10-11 ENCOUNTER — Encounter: Payer: Self-pay | Admitting: Family Medicine

## 2021-10-11 ENCOUNTER — Encounter: Payer: Medicare HMO | Admitting: Family

## 2021-10-11 LAB — LIPID PANEL
Chol/HDL Ratio: 2.5 ratio (ref 0.0–5.0)
Cholesterol, Total: 174 mg/dL (ref 100–199)
HDL: 71 mg/dL (ref >39)
LDL Chol Calc (NIH): 75 mg/dL (ref 0–99)
Triglycerides: 165 mg/dL — ABNORMAL HIGH (ref 0–149)
VLDL Cholesterol Cal: 28 mg/dL (ref 5–40)

## 2021-10-11 LAB — BASIC METABOLIC PANEL
BUN/Creatinine Ratio: 7 — ABNORMAL LOW (ref 10–24)
BUN: 4 mg/dL — ABNORMAL LOW (ref 8–27)
CO2: 24 mmol/L (ref 20–29)
Calcium: 9.5 mg/dL (ref 8.6–10.2)
Chloride: 92 mmol/L — ABNORMAL LOW (ref 96–106)
Creatinine, Ser: 0.57 mg/dL — ABNORMAL LOW (ref 0.76–1.27)
Glucose: 98 mg/dL (ref 70–99)
Potassium: 4.8 mmol/L (ref 3.5–5.2)
Sodium: 131 mmol/L — ABNORMAL LOW (ref 134–144)
eGFR: 104 mL/min/{1.73_m2} (ref >59)

## 2021-10-11 LAB — AST: AST: 25 IU/L (ref 0–40)

## 2021-10-11 LAB — ALT: ALT: 15 IU/L (ref 0–44)

## 2021-11-03 NOTE — Progress Notes (Signed)
Patient ID: Glenn Blevins, male    DOB: 1951-06-07  MRN: [id number]  CC: Blood Pressure Check  Subjective: Glenn Blevins is a 69 y.o. male who presents for blood pressure check. He is accompanied by his spouse.  His concerns today include:   BLOOD PRESSURE FOLLOW-UP: 10/10/2021 per MD note: Elevated readings. Patient defers starting agent in spite of multiple elevated readings blaming [name]. Will monitor.  11/08/2021: Reports thinks elevated blood pressure may be related to stress, currently going through selling process of home. Agreeable to beginning blood pressure medication. Not checking blood pressure at home but does have a home blood pressure monitor. Doesn't eat much salt in the diet. Denies chest pain, shortness of breath, worst headache of life, and additional red flag symptoms.    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 15 mL 3   naltrexone (DEPADE) 50 MG tablet Take 1 tablet (50 mg total) by mouth daily. 30 tablet 1   timolol (TIMOPTIC) 0.5 % ophthalmic solution INSTILL 1 DROP INTO LEFT EYE TWICE A DAY 5 mL 12   No current facility-administered medications on file prior to visit.    No Known Allergies  Social History   Socioeconomic History   Marital status: Married    Spouse name: Not on file   Number of children: Not on file   Years of education: Not on file   Highest education level: Not on file   Occupational History   Not on file  Tobacco Use   Smoking status: Never   Smokeless tobacco: Never  Vaping Use   Vaping Use: Never used  Substance and Sexual Activity   Alcohol use: Yes   Drug use: Never   Sexual activity: Yes    Partners: Female    Birth control/protection: None  Other Topics Concern   Not on file  Social History Narrative   Not on file   Social Determinants of Health   Financial Resource Strain: Not on file  Food Insecurity: Not on file  Transportation Needs: Not on file  Physical Activity: Not on file  Stress: Not on file  Social Connections: Not on file  Intimate Partner Violence: Not on file    Family History  Problem Relation Age of Onset   Amblyopia Neg Hx    Blindness Neg Hx    Cancer Neg Hx    Cataracts Neg Hx    Diabetes Neg Hx    Glaucoma Neg Hx    Hypertension Neg Hx    Macular degeneration Neg Hx    Retinal detachment Neg Hx    Strabismus Neg Hx    Stroke Neg Hx    Thyroid disease Neg Hx    Retinitis pigmentosa Neg Hx     Past Surgical History:  Procedure Laterality Date   CATARACT EXTRACTION Bilateral unknown   Dr. Talbert Forest   EYE SURGERY     RETINAL DETACHMENT SURGERY Left     ROS: Review of Systems Negative except as  stated above  PHYSICAL EXAM: BP (!) 156/90 (BP Location: Left Arm, Patient Position: Sitting, Cuff Size: Normal)   Pulse 95   Temp 98.5 F (36.9 C)   Resp 18   Ht 5' 8.27" (1.734 m)   Wt 176 lb 6.4 oz (80 kg)   SpO2 100%   BMI 26.61 kg/m   Physical Exam HENT:     Head: Normocephalic and atraumatic.  Eyes:     Extraocular Movements: Extraocular movements intact.     Conjunctiva/sclera: Conjunctivae normal.     Pupils: Pupils are equal, round, and reactive to light.  Cardiovascular:     Rate and Rhythm: Normal rate and regular rhythm.     Pulses: Normal pulses.     Heart sounds: Normal heart sounds.  Pulmonary:     Effort: Pulmonary effort is normal.     Breath sounds: Normal breath sounds.   Musculoskeletal:     Cervical back: Normal range of motion and neck supple.  Neurological:     General: No focal deficit present.     Mental Status: He is alert and oriented to person, place, and time.  Psychiatric:        Mood and Affect: Mood normal.        Behavior: Behavior normal.    ASSESSMENT AND PLAN: 1. Primary hypertension: - Newly diagnosed.  - Begin Amlodipine as prescribed. - Counseled on blood pressure goal of less than 140/90, low-sodium, DASH diet, medication compliance, 150 minutes of moderate intensity exercise per week as tolerated. Discussed medication compliance, adverse effects. - Follow-up with primary provider in 2 weeks or sooner if needed. - amLODipine (NORVASC) 2.5 MG tablet; Take 1 tablet (2.5 mg total) by mouth daily.  Dispense: 30 tablet; Refill: 0  2. Need for pneumococcal vaccination: - Administered today in office.  - Pneumococcal conjugate vaccine 20-valent   Patient was given the opportunity to ask questions.  Patient verbalized understanding of the plan and was able to repeat key elements of the plan. Patient was given clear instructions to go to Emergency Department or return to medical center if symptoms don't improve, worsen, or new problems develop.The patient verbalized understanding.   Orders Placed This Encounter  Procedures   Pneumococcal conjugate vaccine 20-valent     Requested Prescriptions   Signed Prescriptions Disp Refills   amLODipine (NORVASC) 2.5 MG tablet 30 tablet 0    Sig: Take 1 tablet (2.5 mg total) by mouth daily.    Return in about 2 weeks (around 11/22/2021) for Follow-Up or next available hypertension with Dorna Mai, MD.  Camillia Herter, NP

## 2021-11-08 ENCOUNTER — Ambulatory Visit (INDEPENDENT_AMBULATORY_CARE_PROVIDER_SITE_OTHER): Payer: Medicare HMO | Admitting: Family

## 2021-11-08 ENCOUNTER — Encounter: Payer: Self-pay | Admitting: Family

## 2021-11-08 ENCOUNTER — Other Ambulatory Visit: Payer: Self-pay

## 2021-11-08 VITALS — BP 156/90 | HR 95 | Temp 98.5°F | Resp 18 | Ht 68.27 in | Wt 176.4 lb

## 2021-11-08 DIAGNOSIS — Z23 Encounter for immunization: Secondary | ICD-10-CM

## 2021-11-08 DIAGNOSIS — I10 Essential (primary) hypertension: Secondary | ICD-10-CM

## 2021-11-08 MED ORDER — AMLODIPINE BESYLATE 2.5 MG PO TABS: 2.5000 mg | ORAL_TABLET | Freq: Every day | ORAL | 0 refills | Status: DC

## 2021-11-08 NOTE — Progress Notes (Signed)
Pt presents for hypertension follow-up, accompanied with spouse Cristie Hem

## 2021-11-09 ENCOUNTER — Encounter (INDEPENDENT_AMBULATORY_CARE_PROVIDER_SITE_OTHER): Payer: Self-pay

## 2021-11-22 ENCOUNTER — Ambulatory Visit: Payer: Medicare HMO | Admitting: Family Medicine

## 2021-11-23 ENCOUNTER — Other Ambulatory Visit: Payer: Self-pay

## 2021-11-23 ENCOUNTER — Ambulatory Visit (INDEPENDENT_AMBULATORY_CARE_PROVIDER_SITE_OTHER): Payer: Medicare HMO | Admitting: Family

## 2021-11-23 VITALS — BP 134/71 | HR 104 | Temp 98.6°F | Resp 18 | Ht 68.27 in | Wt 174.6 lb

## 2021-11-23 DIAGNOSIS — I10 Essential (primary) hypertension: Secondary | ICD-10-CM | POA: Diagnosis not present

## 2021-11-23 DIAGNOSIS — Z0000 Encounter for general adult medical examination without abnormal findings: Secondary | ICD-10-CM

## 2021-11-23 DIAGNOSIS — Z1211 Encounter for screening for malignant neoplasm of colon: Secondary | ICD-10-CM

## 2021-11-23 MED ORDER — AMLODIPINE BESYLATE 2.5 MG PO TABS: 2.5000 mg | ORAL_TABLET | Freq: Every day | ORAL | 2 refills | Status: DC

## 2021-11-23 NOTE — Progress Notes (Signed)
Patient ID: Glenn Blevins, male    DOB: 1951-03-25  MRN: [id number]  CC: Hypertension   Subjective: Glenn Blevins is a 69 y.o. male who presents for hypertension follow-up.   His concerns today include:   HYPERTENSION FOLLOW-UP: 11/08/2021: - Newly diagnosed.  - Begin Amlodipine as prescribed.  11/23/2021: Doing well on current regimen. No side effects. No issues/concerns. Denies chest pain and shortness of breath. Home blood pressures 140's/80's on average.   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 15 mL 3   naltrexone (DEPADE) 50 MG tablet Take 1 tablet (50 mg total) by mouth daily. 30 tablet 1   timolol (TIMOPTIC) 0.5 % ophthalmic solution INSTILL 1 DROP INTO LEFT EYE TWICE A DAY 5 mL 12   No current facility-administered medications on file prior to visit.    No Known Allergies  Social History   Socioeconomic History   Marital status: Married    Spouse name: Not on file   Number of children: Not on file   Years of education: Not on file   Highest education level: Not on file  Occupational History   Not on file  Tobacco Use   Smoking status: Never   Smokeless tobacco: Never  Vaping Use   Vaping Use: Never used  Substance and Sexual Activity   Alcohol use: Yes   Drug use: Never   Sexual activity: Yes    Partners: Female    Birth control/protection: None  Other Topics Concern   Not on file   Social History Narrative   Not on file   Social Determinants of Health   Financial Resource Strain: Not on file  Food Insecurity: Not on file  Transportation Needs: Not on file  Physical Activity: Not on file  Stress: Not on file  Social Connections: Not on file  Intimate Partner Violence: Not on file    Family History  Problem Relation Age of Onset   Amblyopia Neg Hx    Blindness Neg Hx    Cancer Neg Hx    Cataracts Neg Hx    Diabetes Neg Hx    Glaucoma Neg Hx    Hypertension Neg Hx    Macular degeneration Neg Hx    Retinal detachment Neg Hx    Strabismus Neg Hx    Stroke Neg Hx    Thyroid disease Neg Hx    Retinitis pigmentosa Neg Hx     Past Surgical History:  Procedure Laterality Date   CATARACT EXTRACTION Bilateral unknown   Dr. Talbert Forest   EYE SURGERY     RETINAL DETACHMENT SURGERY Left     ROS: Review of Systems Negative except as stated above  PHYSICAL EXAM: BP 134/71 (BP Location: Left Arm, Patient Position: Sitting, Cuff Size: Normal)    Pulse (!) 104    Temp 98.6 F (37 C)    Resp 18    Ht 5' 8.27" (1.734 m)    Wt 174 lb 9.6 oz (79.2 kg)  SpO2 97%    BMI 26.34 kg/m  Physical Exam HENT:     Head: Normocephalic and atraumatic.  Eyes:     Extraocular Movements: Extraocular movements intact.     Conjunctiva/sclera: Conjunctivae normal.     Pupils: Pupils are equal, round, and reactive to light.  Cardiovascular:     Rate and Rhythm: Normal rate and regular rhythm.     Pulses: Normal pulses.     Heart sounds: Normal heart sounds.  Pulmonary:     Effort: Pulmonary effort is normal.     Breath sounds: Normal breath sounds.  Musculoskeletal:     Cervical back: Normal range of motion and neck supple.  Neurological:     General: No focal deficit present.     Mental Status: He is alert and oriented to person, place, and time.  Psychiatric:        Mood and Affect: Mood normal.        Behavior: Behavior normal.    ASSESSMENT AND PLAN: 1. Primary  hypertension: - Continue Amlodipine as prescribed. - Counseled on blood pressure goal of less than 140/90, low-sodium, DASH diet, medication compliance, 150 minutes of moderate intensity exercise per week as tolerated. Discussed medication compliance, adverse effects. - Follow-up with primary provider in 3 months or sooner if needed. - amLODipine (NORVASC) 2.5 MG tablet; Take 1 tablet (2.5 mg total) by mouth daily.  Dispense: 30 tablet; Refill: 2    Patient was given the opportunity to ask questions.  Patient verbalized understanding of the plan and was able to repeat key elements of the plan. Patient was given clear instructions to go to Emergency Department or return to medical center if symptoms don't improve, worsen, or new problems develop.The patient verbalized understanding.  Requested Prescriptions   Signed Prescriptions Disp Refills   amLODipine (NORVASC) 2.5 MG tablet 30 tablet 2    Sig: Take 1 tablet (2.5 mg total) by mouth daily.    Return in about 3 months (around 02/21/2022) for Follow-Up or next available hypertension and Medicare Wellness with Dorna Mai, MD.  Camillia Herter, NP

## 2021-11-23 NOTE — Progress Notes (Signed)
Pt presents for hypertension follow-up  ?

## 2021-11-27 ENCOUNTER — Ambulatory Visit (INDEPENDENT_AMBULATORY_CARE_PROVIDER_SITE_OTHER): Payer: Medicare HMO | Admitting: Ophthalmology

## 2021-11-27 ENCOUNTER — Other Ambulatory Visit: Payer: Self-pay

## 2021-11-27 DIAGNOSIS — H353221 Exudative age-related macular degeneration, left eye, with active choroidal neovascularization: Secondary | ICD-10-CM | POA: Diagnosis not present

## 2021-11-27 DIAGNOSIS — H35022 Exudative retinopathy, left eye: Secondary | ICD-10-CM

## 2021-11-27 DIAGNOSIS — H353222 Exudative age-related macular degeneration, left eye, with inactive choroidal neovascularization: Secondary | ICD-10-CM | POA: Diagnosis not present

## 2021-11-27 DIAGNOSIS — Z8669 Personal history of other diseases of the nervous system and sense organs: Secondary | ICD-10-CM | POA: Diagnosis not present

## 2021-11-27 NOTE — Progress Notes (Signed)
11/27/2021     CHIEF COMPLAINT Patient presents for  Chief Complaint  Patient presents with   Retina Evaluation      HISTORY OF PRESENT ILLNESS: Glenn Blevins is a 70 y.o. male who presents to the clinic today for:   HPI   History of prior RD from [facility], Last edited by Hurman Horn, MD on 11/27/2021  2:46 PM.      Referring physician: Nicolette Bang, MD [address],  [place] [address]  HISTORICAL INFORMATION:   Selected notes from the MEDICAL RECORD NUMBER       CURRENT MEDICATIONS: Current Outpatient Medications (Ophthalmic Drugs)  Medication Sig   brimonidine (ALPHAGAN) 0.15 % ophthalmic solution PLACE 1 DROP INTO THE LEFT EYE Blevins 8 (EIGHT) HOURS.   timolol (TIMOPTIC) 0.5 % ophthalmic solution INSTILL 1 DROP INTO LEFT EYE TWICE A DAY   No current facility-administered medications for this visit. (Ophthalmic Drugs)   Current Outpatient Medications (Other)  Medication Sig   amLODipine (NORVASC) 2.5 MG tablet Take 1 tablet (2.5 mg total) by mouth daily.   naltrexone (DEPADE) 50 MG tablet Take 1 tablet (50 mg total) by mouth daily.   No current facility-administered medications for this visit. (Other)      REVIEW OF SYSTEMS: ROS   Negative for: Constitutional, Gastrointestinal, Neurological, Skin, Genitourinary, Musculoskeletal, HENT, Endocrine, Cardiovascular, Eyes, Respiratory, Psychiatric, Allergic/Imm, Heme/Lymph Last edited by Hurman Horn, MD on 11/27/2021  2:13 PM.       ALLERGIES No Known Allergies  PAST MEDICAL HISTORY Past Medical History:  Diagnosis Date   Psoriasis    Past Surgical History:  Procedure Laterality Date   CATARACT EXTRACTION Bilateral unknown   Dr. Talbert Forest   EYE SURGERY     RETINAL DETACHMENT SURGERY Left     FAMILY HISTORY Family History  Problem Relation Age of Onset   Amblyopia Neg Hx    Blindness Neg Hx    Cancer Neg Hx    Cataracts Neg Hx    Diabetes Neg Hx    Glaucoma Neg  Hx    Hypertension Neg Hx    Macular degeneration Neg Hx    Retinal detachment Neg Hx    Strabismus Neg Hx    Stroke Neg Hx    Thyroid disease Neg Hx    Retinitis pigmentosa Neg Hx     SOCIAL HISTORY Social History   Tobacco Use   Smoking status: Never   Smokeless tobacco: Never  Vaping Use   Vaping Use: Never used  Substance Use Topics   Alcohol use: Yes   Drug use: Never         OPHTHALMIC EXAM:  Base Eye Exam     Visual Acuity (ETDRS)       Right Left   Dist [place] 20/30 20/400   Dist ph [place] 20/20 NI         Tonometry (Tonopen, 2:13 PM)       Right Left   Pressure 11 11         Pupils       Pupils APD   Right PERRL None   Left PERRL Trace         Visual Fields       Left Right     Full   Restrictions Partial inner superior temporal, inferior temporal, superior nasal, inferior nasal deficiencies          Extraocular Movement       Right  Left    Full, Ortho Full, Ortho         Neuro/Psych     Oriented x3: Yes   Mood/Affect: Normal         Dilation     Both eyes: 1.0% Mydriacyl, 2.5% Phenylephrine @ 2:13 PM           Slit Lamp and Fundus Exam     External Exam       Right Left   External Normal Normal         Slit Lamp Exam       Right Left   Lids/Lashes Normal Normal   Conjunctiva/Sclera White and quiet White and quiet   Cornea Clear Clear   Anterior Chamber Deep and quiet Deep and quiet   Iris Round and reactive Round and reactive   Lens Posterior chamber intraocular lens Posterior chamber intraocular lens, 3+ Posterior capsular opacification   Anterior Vitreous Normal , clear oil.Normal         Fundus Exam       Right Left   Posterior Vitreous Normal Silicone oil   Disc Normal Normal   C/D Ratio 0.35 0.5   Macula Normal Normal   Vessels Normal Normal   Periphery Normal Retinal detachment, Rhegmatogenous retinal detachment, Tractional retinal detachment inf nasal to nerve, stable over time, well  demarcated, good laser, no new breaks or tears.            IMAGING AND PROCEDURES  Imaging and Procedures for 11/27/21  OCT, Retina - OU - Both Eyes       Right Eye Quality was good. Scan locations included subfoveal. Central Foveal Thickness: 284. Progression has been stable.   Left Eye Quality was good. Scan locations included subfoveal. Central Foveal Thickness: 295. Progression has been stable.   Notes OD, normal with no active maculopathy  OS, diffuse retinal atrophy, retina detached in the posterior segment.      Color Fundus Photography Optos - OU - Both Eyes       Right Eye Progression has been stable. Disc findings include normal observations. Macula : drusen. Vessels : normal observations. Periphery : normal observations.   Left Eye Progression has been stable. Periphery : detachment.   Notes Inferonasal atrophic tractional retinal detachment shallow stable, well demarcated, good retinopexy with clear silicone oil.  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  Option to remove the silicone oil has been discussed in the past with high risk of reattachment and poor chance of visual acuity improvement with the total macular atrophy noted on OCT evaluation, we have elected to continue to to leave the oil in place in order to prevent excessive treatment burden for this patient with an excellent fellow eye.  We do continue on oral vitamin B complex supplementation as in this practice we have found less progressive optic atrophy with longstanding retained silicone oil     [id number]   1. Exudative age-related macular degeneration of left eye with  active choroidal neovascularization (HCC)  H35.3221 OCT, Retina - OU - Both Eyes    2. Exudative age-related macular degeneration of left eye with inactive choroidal neovascularization (HCC)  J62.8366 Color Fundus Photography Optos -  OU - Both Eyes    3. Exudative retinopathy of left eye  H35.022     4. History of retinal detachment  Z86.69       1.  No active extra macular CNVM active today OS.  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  2.  Mild intermediate ARMD OD, no sign of CNVM stable acuity  3.  Patient to follow-up with retina specialist physician in [facility] per his choosing   Ophthalmic Meds Ordered this visit:  No orders of the defined types were placed in this encounter.      Return in about 6 months (around 05/28/2022) for Follow-up here or in [facility] [address] as per patient is moving, DILATE OU, COLOR FP, OCT.  There are no Patient Instructions on file for this visit.   Explained the diagnoses, plan, and follow up with the patient and they expressed understanding.  Patient expressed understanding of the importance of proper follow up care.   Clent Demark Auda Finfrock M.D. Diseases & Surgery of the Retina and Vitreous [facility] [place] 11/27/21     Abbreviations: M myopia (nearsighted); A astigmatism; H hyperopia (farsighted); P presbyopia; Mrx spectacle prescription;  CTL contact lenses; OD right eye; OS left eye; OU both eyes  XT exotropia; ET esotropia; PEK punctate epithelial keratitis; PEE punctate epithelial erosions; DES dry eye syndrome; MGD meibomian gland dysfunction; ATs artificial tears; PFAT's preservative free artificial tears; [place] nuclear sclerotic cataract; PSC posterior subcapsular cataract; ERM epi-retinal membrane; PVD posterior vitreous detachment; RD retinal detachment;  DM diabetes mellitus; DR diabetic retinopathy; NPDR non-proliferative diabetic retinopathy; PDR proliferative diabetic retinopathy; CSME clinically significant macular edema; DME diabetic macular edema; dbh dot blot hemorrhages; CWS cotton wool spot; POAG primary open angle glaucoma; C/D cup-to-disc ratio; HVF humphrey visual field; GVF goldmann visual field; OCT optical coherence tomography; IOP intraocular pressure; BRVO Branch retinal vein occlusion; CRVO central retinal vein occlusion; CRAO central retinal artery occlusion; BRAO branch retinal artery occlusion; RT retinal tear; SB scleral buckle; PPV pars plana vitrectomy; VH Vitreous hemorrhage; PRP panretinal laser photocoagulation; IVK intravitreal kenalog; VMT vitreomacular traction; MH Macular hole;  NVD neovascularization of the disc; NVE neovascularization elsewhere; AREDS age related eye disease study; ARMD age related macular degeneration; POAG primary open angle glaucoma; EBMD epithelial/anterior basement membrane dystrophy; ACIOL anterior chamber intraocular lens; IOL intraocular lens; PCIOL posterior chamber intraocular lens; Phaco/IOL phacoemulsification with intraocular lens placement; [place] photorefractive keratectomy; LASIK laser assisted in situ keratomileusis; HTN hypertension; DM diabetes mellitus; COPD chronic obstructive pulmonary disease

## 2021-11-27 NOTE — Assessment & Plan Note (Signed)
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  Option to remove the silicone oil has been discussed in the past with high risk of reattachment and poor chance of visual acuity improvement with the total macular atrophy noted on OCT evaluation, we have elected to continue to to leave the oil in place in order to prevent excessive treatment burden for this patient with an excellent fellow eye.  We do continue on oral vitamin B complex supplementation as in this practice we have found less progressive optic atrophy with longstanding retained silicone oil

## 2021-11-27 NOTE — Assessment & Plan Note (Signed)
OS stable, no recurrences of hemorrhage, post vitrectomy, restoration of near total retinal reattachment with residual tractional atrophic retinal changes inferonasal

## 2021-12-19 ENCOUNTER — Other Ambulatory Visit: Payer: Self-pay

## 2021-12-19 ENCOUNTER — Ambulatory Visit (INDEPENDENT_AMBULATORY_CARE_PROVIDER_SITE_OTHER): Payer: Medicare HMO | Admitting: Family Medicine

## 2021-12-19 ENCOUNTER — Encounter: Payer: Self-pay | Admitting: Family Medicine

## 2021-12-19 VITALS — BP 118/83 | HR 98 | Ht 68.0 in | Wt 175.4 lb

## 2021-12-19 DIAGNOSIS — Z131 Encounter for screening for diabetes mellitus: Secondary | ICD-10-CM | POA: Diagnosis not present

## 2021-12-19 DIAGNOSIS — Z0000 Encounter for general adult medical examination without abnormal findings: Secondary | ICD-10-CM

## 2021-12-19 DIAGNOSIS — Z1329 Encounter for screening for other suspected endocrine disorder: Secondary | ICD-10-CM | POA: Diagnosis not present

## 2021-12-19 DIAGNOSIS — Z136 Encounter for screening for cardiovascular disorders: Secondary | ICD-10-CM | POA: Diagnosis not present

## 2021-12-19 DIAGNOSIS — Z13220 Encounter for screening for lipoid disorders: Secondary | ICD-10-CM

## 2021-12-19 DIAGNOSIS — Z125 Encounter for screening for malignant neoplasm of prostate: Secondary | ICD-10-CM | POA: Diagnosis not present

## 2021-12-20 LAB — CBC WITH DIFFERENTIAL/PLATELET
Basophils Absolute: 0.1 10*3/uL (ref 0.0–0.2)
Basos: 1 %
EOS (ABSOLUTE): 0.1 10*3/uL (ref 0.0–0.4)
Eos: 3 %
Hematocrit: 39.8 % (ref 37.5–51.0)
Hemoglobin: 13.6 g/dL (ref 13.0–17.7)
Immature Grans (Abs): 0 10*3/uL (ref 0.0–0.1)
Immature Granulocytes: 0 %
Lymphocytes Absolute: 1.1 10*3/uL (ref 0.7–3.1)
Lymphs: 22 %
MCH: 31.6 pg (ref 26.6–33.0)
MCHC: 34.2 g/dL (ref 31.5–35.7)
MCV: 93 fL (ref 79–97)
Monocytes Absolute: 0.9 10*3/uL (ref 0.1–0.9)
Monocytes: 19 %
Neutrophils Absolute: 2.7 10*3/uL (ref 1.4–7.0)
Neutrophils: 55 %
Platelets: 267 10*3/uL (ref 150–450)
RBC: 4.3 x10E6/uL (ref 4.14–5.80)
RDW: 12.1 % (ref 11.6–15.4)
WBC: 4.8 10*3/uL (ref 3.4–10.8)

## 2021-12-20 LAB — LIPID PANEL
Chol/HDL Ratio: 2.9 ratio (ref 0.0–5.0)
Cholesterol, Total: 167 mg/dL (ref 100–199)
HDL: 57 mg/dL (ref >39)
LDL Chol Calc (NIH): 97 mg/dL (ref 0–99)
Triglycerides: 70 mg/dL (ref 0–149)
VLDL Cholesterol Cal: 13 mg/dL (ref 5–40)

## 2021-12-20 LAB — CMP14+EGFR
ALT: 8 IU/L (ref 0–44)
AST: 23 IU/L (ref 0–40)
Albumin/Globulin Ratio: 1.2 (ref 1.2–2.2)
Albumin: 4.2 g/dL (ref 3.7–4.7)
Alkaline Phosphatase: 88 IU/L (ref 44–121)
BUN/Creatinine Ratio: 11 (ref 10–24)
BUN: 8 mg/dL (ref 8–27)
Bilirubin Total: 0.6 mg/dL (ref 0.0–1.2)
CO2: 27 mmol/L (ref 20–29)
Calcium: 10.1 mg/dL (ref 8.6–10.2)
Chloride: 93 mmol/L — ABNORMAL LOW (ref 96–106)
Creatinine, Ser: 0.71 mg/dL — ABNORMAL LOW (ref 0.76–1.27)
Globulin, Total: 3.6 g/dL (ref 1.5–4.5)
Glucose: 104 mg/dL — ABNORMAL HIGH (ref 70–99)
Potassium: 4.9 mmol/L (ref 3.5–5.2)
Sodium: 131 mmol/L — ABNORMAL LOW (ref 134–144)
Total Protein: 7.8 g/dL (ref 6.0–8.5)
eGFR: 97 mL/min/{1.73_m2} (ref >59)

## 2021-12-20 LAB — TSH: TSH: 2.45 u[IU]/mL (ref 0.450–4.500)

## 2021-12-20 LAB — HEMOGLOBIN A1C
Est. average glucose Bld gHb Est-mCnc: 111 mg/dL
Hgb A1c MFr Bld: 5.5 % (ref 4.8–5.6)

## 2021-12-20 LAB — PSA: Prostate Specific Ag, Serum: 2.6 ng/mL (ref 0.0–4.0)

## 2021-12-20 NOTE — Progress Notes (Signed)
Established Patient Office Visit  Subjective:  Patient ID: Glenn Blevins, male    DOB: 1951-09-18  Age: 71 y.o. MRN: [id number]  CC:  Chief Complaint  Patient presents with   Medicare Wellness    HPI Glenn Blevins presents for routine annual exam. Patient denies acute complaints or concerns.   Past Medical History:  Diagnosis Date   Psoriasis     Past Surgical History:  Procedure Laterality Date   CATARACT EXTRACTION Bilateral unknown   Dr. Talbert Forest   EYE SURGERY     RETINAL DETACHMENT SURGERY Left     Family History  Problem Relation Age of Onset   Amblyopia Neg Hx    Blindness Neg Hx    Cancer Neg Hx    Cataracts Neg Hx    Diabetes Neg Hx    Glaucoma Neg Hx    Hypertension Neg Hx    Macular degeneration Neg Hx    Retinal detachment Neg Hx    Strabismus Neg Hx    Stroke Neg Hx    Thyroid disease Neg Hx    Retinitis pigmentosa Neg Hx     Social History   Socioeconomic History   Marital status: Married    Spouse name: Not on file   Number of children: Not on file   Years of education: Not on file   Highest education level: Not on file  Occupational History   Not on file  Tobacco Use   Smoking status: Never   Smokeless tobacco: Never  Vaping Use   Vaping Use: Never used  Substance and Sexual Activity   Alcohol use: Yes   Drug use: Never   Sexual activity: Yes    Partners: Female    Birth control/protection: None  Other Topics Concern   Not on file  Social History Narrative   Not on file   Social Determinants of Health   Financial Resource Strain: Not on file  Food Insecurity: Not on file  Transportation Needs: Not on file  Physical Activity: Not on file  Stress: Not on file  Social Connections: Not on file  Intimate Partner Violence: Not on file    ROS Review of Systems  All other systems reviewed and are negative.  Objective:   Today's Vitals: BP 118/83    Pulse 98    Ht 5' 8"  (1.727 m)    Wt 175 lb 6 oz (79.5 kg)    SpO2 98%     BMI 26.67 kg/m   Physical Exam Vitals and nursing note reviewed.  Constitutional:      General: He is not in acute distress. HENT:     Head: Normocephalic and atraumatic.     Right Ear: Tympanic membrane, ear canal and external ear normal.     Left Ear: Tympanic membrane, ear canal and external ear normal.     Nose: Nose normal.     Mouth/Throat:     Mouth: Mucous membranes are moist.     Pharynx: Oropharynx is clear.  Eyes:     Conjunctiva/sclera: Conjunctivae normal.     Pupils: Pupils are equal, round, and reactive to light.  Neck:     Thyroid: No thyromegaly.  Cardiovascular:     Rate and Rhythm: Normal rate and regular rhythm.     Heart sounds: Normal heart sounds. No murmur heard. Pulmonary:     Effort: Pulmonary effort is normal.     Breath sounds: Normal breath sounds.  Abdominal:     General: There is no  distension.     Palpations: Abdomen is soft. There is no mass.     Tenderness: There is no abdominal tenderness.     Hernia: There is no hernia in the left inguinal area or right inguinal area.  Musculoskeletal:        General: Normal range of motion.     Cervical back: Normal range of motion and neck supple.     Right lower leg: No edema.     Left lower leg: No edema.  Skin:    General: Skin is warm and dry.  Neurological:     General: No focal deficit present.     Mental Status: He is alert and oriented to person, place, and time. Mental status is at baseline.  Psychiatric:        Mood and Affect: Mood normal.        Behavior: Behavior normal.    Assessment & Plan:   1. Encounter for Medicare annual wellness exam Unremarkable exam with routine labs ordered.  - CMP14+EGFR - CBC with Differential - Lipid Panel - PSA  2. Prostate cancer screening  - PSA  3. Encounter for lipid screening for cardiovascular disease  - Lipid Panel  4. Screening for diabetes mellitus (DM)  - Hemoglobin A1c  5. Screening for thyroid disorder  - TSH     Outpatient Encounter Medications as of 12/19/2021  Medication Sig   amLODipine (NORVASC) 2.5 MG tablet Take 1 tablet (2.5 mg total) by mouth daily.   brimonidine (ALPHAGAN) 0.15 % ophthalmic solution PLACE 1 DROP INTO THE LEFT EYE EVERY 8 (EIGHT) HOURS.   naltrexone (DEPADE) 50 MG tablet Take 1 tablet (50 mg total) by mouth daily.   timolol (TIMOPTIC) 0.5 % ophthalmic solution INSTILL 1 DROP INTO LEFT EYE TWICE A DAY   No facility-administered encounter medications on file as of 12/19/2021.    Follow-up: No follow-ups on file.   Becky Sax, MD

## 2022-03-02 ENCOUNTER — Other Ambulatory Visit: Payer: Self-pay | Admitting: Family

## 2022-03-02 DIAGNOSIS — I10 Essential (primary) hypertension: Secondary | ICD-10-CM

## 2022-04-09 DIAGNOSIS — R69 Illness, unspecified: Secondary | ICD-10-CM | POA: Diagnosis not present

## 2022-04-09 DIAGNOSIS — F1020 Alcohol dependence, uncomplicated: Secondary | ICD-10-CM | POA: Diagnosis not present

## 2022-05-31 ENCOUNTER — Other Ambulatory Visit: Payer: Self-pay | Admitting: Family Medicine

## 2022-05-31 ENCOUNTER — Other Ambulatory Visit: Payer: Self-pay

## 2022-05-31 DIAGNOSIS — I10 Essential (primary) hypertension: Secondary | ICD-10-CM

## 2022-05-31 MED ORDER — AMLODIPINE BESYLATE 2.5 MG PO TABS: 2.5000 mg | ORAL_TABLET | Freq: Every day | ORAL | 0 refills | Status: DC

## 2022-05-31 NOTE — Telephone Encounter (Signed)
Refused amlodipine 2.5 mg because it's a duplicate request from earlier today to same pharmacy

## 2022-07-24 ENCOUNTER — Other Ambulatory Visit (INDEPENDENT_AMBULATORY_CARE_PROVIDER_SITE_OTHER): Payer: Self-pay

## 2022-07-24 MED ORDER — BRIMONIDINE TARTRATE 0.15 % OP SOLN: 1.0000 [drp] | Freq: Three times a day (TID) | OPHTHALMIC | 0 refills | Status: AC

## 2022-08-28 ENCOUNTER — Other Ambulatory Visit: Payer: Self-pay | Admitting: Family

## 2022-08-28 DIAGNOSIS — I10 Essential (primary) hypertension: Secondary | ICD-10-CM

## 2022-09-25 ENCOUNTER — Other Ambulatory Visit: Payer: Self-pay | Admitting: Family Medicine

## 2022-09-25 DIAGNOSIS — I10 Essential (primary) hypertension: Secondary | ICD-10-CM

## 2022-12-17 ENCOUNTER — Telehealth: Payer: Self-pay | Admitting: Family Medicine

## 2022-12-17 NOTE — Telephone Encounter (Signed)
Patient moved to [facility]

## 2022-12-31 DIAGNOSIS — H3342 Traction detachment of retina, left eye: Secondary | ICD-10-CM | POA: Diagnosis not present

## 2022-12-31 DIAGNOSIS — Z9889 Other specified postprocedural states: Secondary | ICD-10-CM | POA: Diagnosis not present

## 2023-04-17 DIAGNOSIS — I469 Cardiac arrest, cause unspecified: Secondary | ICD-10-CM | POA: Diagnosis not present

## 2023-05-11 DEATH — deceased
